# Patient Record
Sex: Female | Born: 1975 | Race: White | Hispanic: No | Marital: Married | Smoking: Never smoker
Health system: Southern US, Community
[De-identification: ages and names within clinical notes are randomized; demographics above are authoritative.]

## PROBLEM LIST (undated history)

## (undated) HISTORY — PX: LUMBAR LAMINECTOMY: SHX95

---

## 2011-12-31 ENCOUNTER — Other Ambulatory Visit (HOSPITAL_COMMUNITY)
Admission: RE | Admit: 2011-12-31 | Discharge: 2011-12-31 | Disposition: A | Discharge status: Home / Self Care | Payer: Self-pay | Source: Ambulatory Visit | Attending: Obstetrics & Gynecology | Admitting: Obstetrics & Gynecology

## 2011-12-31 DIAGNOSIS — Z01419 Encounter for gynecological examination (general) (routine) without abnormal findings: Secondary | ICD-10-CM | POA: Insufficient documentation

## 2013-12-10 ENCOUNTER — Telehealth: Payer: Self-pay

## 2013-12-10 MED ORDER — NORGESTIMATE-ETH ESTRADIOL 0.25-35 MG-MCG PO TABS: 1.0000 | ORAL_TABLET | Freq: Every day | ORAL | Status: DC

## 2013-12-10 NOTE — Telephone Encounter (Signed)
Pt requesting refill on sprintec. Pt has an appt for P&P in March.

## 2014-01-27 ENCOUNTER — Telehealth: Payer: Self-pay | Admitting: *Deleted

## 2014-01-27 NOTE — Telephone Encounter (Signed)
Informed pt that on 12/10/13 we refilled Rx with 11 refills, instructed her to call her pharmacy for refill and if there are any problems let us know.  Pt verbalized understanding.

## 2014-02-08 ENCOUNTER — Other Ambulatory Visit: Payer: Self-pay | Admitting: Obstetrics & Gynecology

## 2014-02-16 ENCOUNTER — Ambulatory Visit (INDEPENDENT_AMBULATORY_CARE_PROVIDER_SITE_OTHER): Payer: BC Managed Care – PPO | Admitting: Obstetrics & Gynecology

## 2014-02-16 ENCOUNTER — Other Ambulatory Visit (HOSPITAL_COMMUNITY)
Admission: RE | Admit: 2014-02-16 | Discharge: 2014-02-16 | Disposition: A | Discharge status: Home / Self Care | Payer: BC Managed Care – PPO | Source: Ambulatory Visit | Attending: Obstetrics & Gynecology | Admitting: Obstetrics & Gynecology

## 2014-02-16 ENCOUNTER — Encounter: Payer: Self-pay | Admitting: Obstetrics & Gynecology

## 2014-02-16 VITALS — BP 110/70 | Ht 64.0 in | Wt 137.0 lb

## 2014-02-16 DIAGNOSIS — Z1151 Encounter for screening for human papillomavirus (HPV): Secondary | ICD-10-CM | POA: Insufficient documentation

## 2014-02-16 DIAGNOSIS — Z01419 Encounter for gynecological examination (general) (routine) without abnormal findings: Secondary | ICD-10-CM | POA: Insufficient documentation

## 2014-02-16 MED ORDER — NORGESTIMATE-ETH ESTRADIOL 0.25-35 MG-MCG PO TABS: 1.0000 | ORAL_TABLET | Freq: Every day | ORAL | Status: DC

## 2014-02-16 NOTE — Addendum Note (Signed)
Addended by: Lazaro ArmsEURE, Lennie Vasco H on: 02/16/2014 10:09 AM   Modules accepted: Orders

## 2014-02-16 NOTE — Addendum Note (Signed)
Addended by: Richardson ChiquitoRAVIS, ASHLEY M on: 02/16/2014 10:57 AM   Modules accepted: Orders

## 2014-02-16 NOTE — Progress Notes (Signed)
Patient ID: Rhonda Rhodes, female   DOB: 06/11/1976, 38 y.o.   MRN: [id number] Subjective:     Rhonda Rhodes is a 38 y.o. female here for a routine exam.  Patient's last menstrual period was 01/22/2014. No obstetric history on file. Birth Control Method:  OCP Menstrual Calendar(currently): regular  Current complaints: none.   Current acute medical issues:  none   Recent Gynecologic History Patient's last menstrual period was 01/22/2014. Last Pap: 2014,  normal Last mammogram: na,    History reviewed. No pertinent past medical history.  History reviewed. No pertinent past surgical history.  OB History   Grav Para Term Preterm Abortions TAB SAB Ect Mult Living                  History   Social History  . Marital Status: Unknown    Spouse Name: N/A    Number of Children: N/A  . Years of Education: N/A   Social History Main Topics  . Smoking status: Never Smoker   . Smokeless tobacco: None  . Alcohol Use: Yes  . Drug Use: None  . Sexual Activity: Yes   Other Topics Concern  . None   Social History Narrative  . None    History reviewed. No pertinent family history.   Review of Systems  Review of Systems  Constitutional: Negative for fever, chills, weight loss, malaise/fatigue and diaphoresis.  HENT: Negative for hearing loss, ear pain, nosebleeds, congestion, sore throat, neck pain, tinnitus and ear discharge.   Eyes: Negative for blurred vision, double vision, photophobia, pain, discharge and redness.  Respiratory: Negative for cough, hemoptysis, sputum production, shortness of breath, wheezing and stridor.   Cardiovascular: Negative for chest pain, palpitations, orthopnea, claudication, leg swelling and PND.  Gastrointestinal: negative for abdominal pain. Negative for heartburn, nausea, vomiting, diarrhea, constipation, blood in stool and melena.  Genitourinary: Negative for dysuria, urgency, frequency, hematuria and flank pain.  Musculoskeletal: Negative  for myalgias, back pain, joint pain and falls.  Skin: Negative for itching and rash.  Neurological: Negative for dizziness, tingling, tremors, sensory change, speech change, focal weakness, seizures, loss of consciousness, weakness and headaches.  Endo/Heme/Allergies: Negative for environmental allergies and polydipsia. Does not bruise/bleed easily.  Psychiatric/Behavioral: Negative for depression, suicidal ideas, hallucinations, memory loss and substance abuse. The patient is not nervous/anxious and does not have insomnia.        Objective:    Physical Exam  Vitals reviewed. Constitutional: She is oriented to person, place, and time. She appears well-developed and well-nourished.  HENT:  Head: Normocephalic and atraumatic.        Right Ear: External ear normal.  Left Ear: External ear normal.  Nose: Nose normal.  Mouth/Throat: Oropharynx is clear and moist.  Eyes: Conjunctivae and EOM are normal. Pupils are equal, round, and reactive to light. Right eye exhibits no discharge. Left eye exhibits no discharge. No scleral icterus.  Neck: Normal range of motion. Neck supple. No tracheal deviation present. No thyromegaly present.  Cardiovascular: Normal rate, regular rhythm, normal heart sounds and intact distal pulses.  Exam reveals no gallop and no friction rub.   No murmur heard. Respiratory: Effort normal and breath sounds normal. No respiratory distress. She has no wheezes. She has no rales. She exhibits no tenderness.  GI: Soft. Bowel sounds are normal. She exhibits no distension and no mass. There is no tenderness. There is no rebound and no guarding.  Genitourinary:  Breasts no masses skin changes or nipple changes bilaterally  Vulva is normal without lesions Vagina is pink moist without discharge Cervix normal in appearance and pap is done Uterus is normal size shape and contour Adnexa is negative with normal sized ovaries   Musculoskeletal: Normal range of motion. She  exhibits no edema and no tenderness.  Neurological: She is alert and oriented to person, place, and time. She has normal reflexes. She displays normal reflexes. No cranial nerve deficit. She exhibits normal muscle tone. Coordination normal.  Skin: Skin is warm and dry. No rash noted. No erythema. No pallor.  Psychiatric: She has a normal mood and affect. Her behavior is normal. Judgment and thought content normal.       Assessment:    Healthy female exam.    Plan:    Contraception: OCP (estrogen/progesterone). Follow up in: 1 year.

## 2015-01-25 ENCOUNTER — Other Ambulatory Visit: Payer: Self-pay | Admitting: *Deleted

## 2015-01-25 MED ORDER — NORGESTIMATE-ETH ESTRADIOL 0.25-35 MG-MCG PO TABS: 1.0000 | ORAL_TABLET | Freq: Every day | ORAL | Status: DC

## 2015-02-21 ENCOUNTER — Other Ambulatory Visit: Payer: Self-pay | Admitting: Obstetrics & Gynecology

## 2015-03-07 ENCOUNTER — Encounter: Payer: Self-pay | Admitting: Obstetrics & Gynecology

## 2015-03-07 ENCOUNTER — Other Ambulatory Visit (HOSPITAL_COMMUNITY)
Admission: RE | Admit: 2015-03-07 | Discharge: 2015-03-07 | Disposition: A | Discharge status: Home / Self Care | Payer: Self-pay | Source: Ambulatory Visit | Attending: Obstetrics & Gynecology | Admitting: Obstetrics & Gynecology

## 2015-03-07 ENCOUNTER — Ambulatory Visit (INDEPENDENT_AMBULATORY_CARE_PROVIDER_SITE_OTHER): Payer: No Typology Code available for payment source | Admitting: Obstetrics & Gynecology

## 2015-03-07 VITALS — BP 108/80 | HR 64 | Ht 64.0 in | Wt 132.0 lb

## 2015-03-07 DIAGNOSIS — Z01419 Encounter for gynecological examination (general) (routine) without abnormal findings: Secondary | ICD-10-CM

## 2015-03-07 DIAGNOSIS — Z01411 Encounter for gynecological examination (general) (routine) with abnormal findings: Secondary | ICD-10-CM | POA: Insufficient documentation

## 2015-03-07 MED ORDER — NORGESTIMATE-ETH ESTRADIOL 0.25-35 MG-MCG PO TABS: 1.0000 | ORAL_TABLET | Freq: Every day | ORAL | Status: DC

## 2015-03-07 NOTE — Progress Notes (Signed)
Patient ID: Rhonda Rhodes, female   DOB: [birth year], 39 y.o.   MRN: [id number] Subjective:     Rhonda Rhodes is a 39 y.o. female here for a routine exam.  Patient's last menstrual period was 02/15/2015. No obstetric history on file. Birth Control Method:  Norgestimate 35 mic EE Menstrual Calendar(currently): regular  Current complaints: none.   Current acute medical issues:  none   Recent Gynecologic History Patient's last menstrual period was 02/15/2015. Last Pap: 2015,  normal Last mammogram: never,    History reviewed. No pertinent past medical history.  History reviewed. No pertinent past surgical history.  OB History    No data available      History   Social History  . Marital Status: Unknown    Spouse Name: N/A  . Number of Children: N/A  . Years of Education: N/A   Social History Main Topics  . Smoking status: Never Smoker   . Smokeless tobacco: Not on file  . Alcohol Use: Yes  . Drug Use: Not on file  . Sexual Activity: Yes   Other Topics Concern  . None   Social History Narrative    History reviewed. No pertinent family history.   Current outpatient prescriptions:  .  norgestimate-ethinyl estradiol (ORTHO-CYCLEN,SPRINTEC,PREVIFEM) 0.25-35 MG-MCG tablet, Take 1 tablet by mouth daily., Disp: 1 Package, Rfl: 11  Review of Systems  Review of Systems  Constitutional: Negative for fever, chills, weight loss, malaise/fatigue and diaphoresis.  HENT: Negative for hearing loss, ear pain, nosebleeds, congestion, sore throat, neck pain, tinnitus and ear discharge.   Eyes: Negative for blurred vision, double vision, photophobia, pain, discharge and redness.  Respiratory: Negative for cough, hemoptysis, sputum production, shortness of breath, wheezing and stridor.   Cardiovascular: Negative for chest pain, palpitations, orthopnea, claudication, leg swelling and PND.  Gastrointestinal: negative for abdominal pain. Negative for heartburn, nausea, vomiting,  diarrhea, constipation, blood in stool and melena.  Genitourinary: Negative for dysuria, urgency, frequency, hematuria and flank pain.  Musculoskeletal: Negative for myalgias, back pain, joint pain and falls.  Skin: Negative for itching and rash.  Neurological: Negative for dizziness, tingling, tremors, sensory change, speech change, focal weakness, seizures, loss of consciousness, weakness and headaches.  Endo/Heme/Allergies: Negative for environmental allergies and polydipsia. Does not bruise/bleed easily.  Psychiatric/Behavioral: Negative for depression, suicidal ideas, hallucinations, memory loss and substance abuse. The patient is not nervous/anxious and does not have insomnia.        Objective:  Blood pressure 108/80, pulse 64, height  (1.626 m), weight 132 lb (59.875 kg), last menstrual period 02/15/2015.   Physical Exam  Vitals reviewed. Constitutional: She is oriented to person, place, and time. She appears well-developed and well-nourished.  HENT:  Head: Normocephalic and atraumatic.        Right Ear: External ear normal.  Left Ear: External ear normal.  Nose: Nose normal.  Mouth/Throat: Oropharynx is clear and moist.  Eyes: Conjunctivae and EOM are normal. Pupils are equal, round, and reactive to light. Right eye exhibits no discharge. Left eye exhibits no discharge. No scleral icterus.  Neck: Normal range of motion. Neck supple. No tracheal deviation present. No thyromegaly present.  Cardiovascular: Normal rate, regular rhythm, normal heart sounds and intact distal pulses.  Exam reveals no gallop and no friction rub.   No murmur heard. Respiratory: Effort normal and breath sounds normal. No respiratory distress. She has no wheezes. She has no rales. She exhibits no tenderness.  GI: Soft. Bowel sounds are normal. She exhibits  no distension and no mass. There is no tenderness. There is no rebound and no guarding.  Genitourinary:  Breasts no masses skin changes or nipple  changes bilaterally      Vulva is normal without lesions Vagina is pink moist without discharge Cervix normal in appearance and pap is done Uterus is normal size shape and contour Adnexa is negative with normal sized ovaries   Musculoskeletal: Normal range of motion. She exhibits no edema and no tenderness.  Neurological: She is alert and oriented to person, place, and time. She has normal reflexes. She displays normal reflexes. No cranial nerve deficit. She exhibits normal muscle tone. Coordination normal.  Skin: Skin is warm and dry. No rash noted. No erythema. No pallor.  Psychiatric: She has a normal mood and affect. Her behavior is normal. Judgment and thought content normal.       Assessment:    Healthy female exam.    Plan:    Contraception: OCP (estrogen/progesterone).   Follow up 1 year

## 2015-03-08 LAB — CYTOLOGY - PAP

## 2015-10-25 ENCOUNTER — Other Ambulatory Visit: Payer: Self-pay | Admitting: Obstetrics & Gynecology

## 2015-10-25 DIAGNOSIS — Z1231 Encounter for screening mammogram for malignant neoplasm of breast: Secondary | ICD-10-CM

## 2015-10-31 ENCOUNTER — Ambulatory Visit (HOSPITAL_COMMUNITY)
Admission: RE | Admit: 2015-10-31 | Discharge: 2015-10-31 | Disposition: A | Discharge status: Home / Self Care | Payer: No Typology Code available for payment source | Source: Ambulatory Visit | Attending: Obstetrics & Gynecology | Admitting: Obstetrics & Gynecology

## 2015-10-31 DIAGNOSIS — Z1231 Encounter for screening mammogram for malignant neoplasm of breast: Secondary | ICD-10-CM | POA: Diagnosis not present

## 2016-02-14 ENCOUNTER — Other Ambulatory Visit: Payer: Self-pay | Admitting: Obstetrics & Gynecology

## 2016-03-12 ENCOUNTER — Other Ambulatory Visit: Payer: No Typology Code available for payment source | Admitting: Obstetrics & Gynecology

## 2016-03-20 ENCOUNTER — Encounter: Payer: Self-pay | Admitting: Obstetrics & Gynecology

## 2016-03-20 ENCOUNTER — Other Ambulatory Visit (HOSPITAL_COMMUNITY)
Admission: RE | Admit: 2016-03-20 | Discharge: 2016-03-20 | Disposition: A | Discharge status: Home / Self Care | Payer: No Typology Code available for payment source | Source: Ambulatory Visit | Attending: Obstetrics & Gynecology | Admitting: Obstetrics & Gynecology

## 2016-03-20 ENCOUNTER — Ambulatory Visit (INDEPENDENT_AMBULATORY_CARE_PROVIDER_SITE_OTHER): Payer: PRIVATE HEALTH INSURANCE | Admitting: Obstetrics & Gynecology

## 2016-03-20 VITALS — BP 110/80 | HR 58 | Ht 64.0 in | Wt 134.0 lb

## 2016-03-20 DIAGNOSIS — Z01419 Encounter for gynecological examination (general) (routine) without abnormal findings: Secondary | ICD-10-CM | POA: Insufficient documentation

## 2016-03-20 MED ORDER — NORGESTIMATE-ETH ESTRADIOL 0.25-35 MG-MCG PO TABS: 1.0000 | ORAL_TABLET | Freq: Every day | ORAL | Status: DC

## 2016-03-20 NOTE — Addendum Note (Signed)
Addended by: Lazaro ArmsEURE, Zyheir Daft H on: 03/20/2016 02:35 PM   Modules accepted: Orders

## 2016-03-20 NOTE — Progress Notes (Signed)
Patient ID: Rhonda Rhodes, female   DOB: [birth year], 40 y.o.   MRN: [id number] Subjective:     [name] is a 40 y.o. female here for a routine exam.  Patient's last menstrual period was 03/08/2016. No obstetric history on file. Birth Control Method:  OCP Menstrual Calendar(currently): regular, 3 days, some cramps  Current complaints: none.   Current acute medical issues:  none   Recent Gynecologic History Patient's last menstrual period was 03/08/2016. Last Pap: 2016,  normal Last mammogram: 2016,  normal  History reviewed. No pertinent past medical history.  History reviewed. No pertinent past surgical history.  OB History    No data available      Social History   Social History  . Marital Status: Unknown    Spouse Name: N/A  . Number of Children: N/A  . Years of Education: N/A   Social History Main Topics  . Smoking status: Never Smoker   . Smokeless tobacco: None  . Alcohol Use: Yes  . Drug Use: None  . Sexual Activity: Yes   Other Topics Concern  . None   Social History Narrative    History reviewed. No pertinent family history.   Current outpatient prescriptions:  .  norgestimate-ethinyl estradiol (ORTHO-CYCLEN,SPRINTEC,PREVIFEM) 0.25-35 MG-MCG tablet, Take 1 tablet by mouth daily., Disp: 3 Package, Rfl: 3  Review of Systems  Review of Systems  Constitutional: Negative for fever, chills, weight loss, malaise/fatigue and diaphoresis.  HENT: Negative for hearing loss, ear pain, nosebleeds, congestion, sore throat, neck pain, tinnitus and ear discharge.   Eyes: Negative for blurred vision, double vision, photophobia, pain, discharge and redness.  Respiratory: Negative for cough, hemoptysis, sputum production, shortness of breath, wheezing and stridor.   Cardiovascular: Negative for chest pain, palpitations, orthopnea, claudication, leg swelling and PND.  Gastrointestinal: negative for abdominal pain. Negative for heartburn, nausea, vomiting,  diarrhea, constipation, blood in stool and melena.  Genitourinary: Negative for dysuria, urgency, frequency, hematuria and flank pain.  Musculoskeletal: Negative for myalgias, back pain, joint pain and falls.  Skin: Negative for itching and rash.  Neurological: Negative for dizziness, tingling, tremors, sensory change, speech change, focal weakness, seizures, loss of consciousness, weakness and headaches.  Endo/Heme/Allergies: Negative for environmental allergies and polydipsia. Does not bruise/bleed easily.  Psychiatric/Behavioral: Negative for depression, suicidal ideas, hallucinations, memory loss and substance abuse. The patient is not nervous/anxious and does not have insomnia.        Objective:  Blood pressure 110/80, pulse 58, height  (1.626 m), weight 134 lb (60.782 kg), last menstrual period 03/08/2016.   Physical Exam  Vitals reviewed. Constitutional: She is oriented to person, place, and time. She appears well-developed and well-nourished.  HENT:  Head: Normocephalic and atraumatic.        Right Ear: External ear normal.  Left Ear: External ear normal.  Nose: Nose normal.  Mouth/Throat: Oropharynx is clear and moist.  Eyes: Conjunctivae and EOM are normal. Pupils are equal, round, and reactive to light. Right eye exhibits no discharge. Left eye exhibits no discharge. No scleral icterus.  Neck: Normal range of motion. Neck supple. No tracheal deviation present. No thyromegaly present.  Cardiovascular: Normal rate, regular rhythm, normal heart sounds and intact distal pulses.  Exam reveals no gallop and no friction rub.   No murmur heard. Respiratory: Effort normal and breath sounds normal. No respiratory distress. She has no wheezes. She has no rales. She exhibits no tenderness.  GI: Soft. Bowel sounds are normal. She exhibits no distension  and no mass. There is no tenderness. There is no rebound and no guarding.  Genitourinary:  Breasts no masses skin changes or nipple  changes bilaterally      Vulva is normal without lesions Vagina is pink moist without discharge Cervix normal in appearance and pap is done Uterus is normal size shape and contour Adnexa is negative with normal sized ovaries   Musculoskeletal: Normal range of motion. She exhibits no edema and no tenderness.  Neurological: She is alert and oriented to person, place, and time. She has normal reflexes. She displays normal reflexes. No cranial nerve deficit. She exhibits normal muscle tone. Coordination normal.  Skin: Skin is warm and dry. No rash noted. No erythema. No pallor.  Psychiatric: She has a normal mood and affect. Her behavior is normal. Judgment and thought content normal.       Medications Ordered at today's visit: No orders of the defined types were placed in this encounter.    Other orders placed at today's visit: No orders of the defined types were placed in this encounter.      Assessment:    Healthy female exam.    Plan:    Mammogram ordered. Follow up in: 1 year.

## 2016-03-22 LAB — CYTOLOGY - PAP

## 2016-12-19 ENCOUNTER — Other Ambulatory Visit: Payer: Self-pay | Admitting: Obstetrics & Gynecology

## 2016-12-19 DIAGNOSIS — Z1231 Encounter for screening mammogram for malignant neoplasm of breast: Secondary | ICD-10-CM

## 2017-01-07 ENCOUNTER — Ambulatory Visit (HOSPITAL_COMMUNITY)
Admission: RE | Admit: 2017-01-07 | Discharge: 2017-01-07 | Disposition: A | Discharge status: Home / Self Care | Payer: BLUE CROSS/BLUE SHIELD | Source: Ambulatory Visit | Attending: Obstetrics & Gynecology | Admitting: Obstetrics & Gynecology

## 2017-01-07 ENCOUNTER — Ambulatory Visit: Payer: No Typology Code available for payment source

## 2017-01-07 DIAGNOSIS — Z1231 Encounter for screening mammogram for malignant neoplasm of breast: Secondary | ICD-10-CM

## 2017-03-12 ENCOUNTER — Other Ambulatory Visit: Payer: Self-pay | Admitting: Obstetrics & Gynecology

## 2017-03-28 ENCOUNTER — Other Ambulatory Visit: Payer: PRIVATE HEALTH INSURANCE | Admitting: Obstetrics & Gynecology

## 2017-04-18 ENCOUNTER — Ambulatory Visit (INDEPENDENT_AMBULATORY_CARE_PROVIDER_SITE_OTHER): Payer: BLUE CROSS/BLUE SHIELD | Admitting: Obstetrics & Gynecology

## 2017-04-18 ENCOUNTER — Other Ambulatory Visit (HOSPITAL_COMMUNITY)
Admission: RE | Admit: 2017-04-18 | Discharge: 2017-04-18 | Disposition: A | Discharge status: Home / Self Care | Payer: BLUE CROSS/BLUE SHIELD | Source: Ambulatory Visit | Attending: Obstetrics & Gynecology | Admitting: Obstetrics & Gynecology

## 2017-04-18 ENCOUNTER — Encounter: Payer: Self-pay | Admitting: Obstetrics & Gynecology

## 2017-04-18 VITALS — BP 116/68 | HR 60 | Ht 64.0 in | Wt 139.0 lb

## 2017-04-18 DIAGNOSIS — Z01419 Encounter for gynecological examination (general) (routine) without abnormal findings: Secondary | ICD-10-CM | POA: Insufficient documentation

## 2017-04-18 MED ORDER — SILVER SULFADIAZINE 1 % EX CREA: TOPICAL_CREAM | CUTANEOUS | 11 refills | Status: DC

## 2017-04-18 MED ORDER — NORGESTIMATE-ETH ESTRADIOL 0.25-35 MG-MCG PO TABS: 1.0000 | ORAL_TABLET | Freq: Every day | ORAL | 3 refills | Status: DC

## 2017-04-18 NOTE — Progress Notes (Signed)
Subjective:     Rhonda Rhodes is a 41 y.o. female here for a routine exam.  Patient's last menstrual period was 04/05/2017 (exact date). [id number] Birth Control Method:  OCP Menstrual Calendar(currently): regular  Current complaints: episodic area under left axilla since adolescence, inflamed hair follicle vs lymph node Recommend present if persists and topical silvadene.   Current acute medical issues:  none   Recent Gynecologic History Patient's last menstrual period was 04/05/2017 (exact date). Last Pap: 2017,  normal Last mammogram: 2018,  normal  History reviewed. No pertinent past medical history.  History reviewed. No pertinent surgical history.  OB History    Gravida Para Term Preterm AB Living   0 0 0 0 0 0   SAB TAB Ectopic Multiple Live Births   0 0 0 0 0      Social History   Social History  . Marital status: Unknown    Spouse name: N/A  . Number of children: N/A  . Years of education: N/A   Social History Main Topics  . Smoking status: Never Smoker  . Smokeless tobacco: Never Used  . Alcohol use Yes  . Drug use: No  . Sexual activity: Yes    Birth control/ protection: Pill   Other Topics Concern  . None   Social History Narrative  . None    Family History  Problem Relation Age of Onset  . Parkinson's disease Father      Current Outpatient Prescriptions:  .  norgestimate-ethinyl estradiol (SPRINTEC 28) 0.25-35 MG-MCG tablet, Take 1 tablet by mouth daily., Disp: 84 tablet, Rfl: 3 .  silver sulfADIAZINE (SILVADENE) 1 % cream, Apply to area TID prn, Disp: 50 g, Rfl: 11  Review of Systems  Review of Systems  Constitutional: Negative for fever, chills, weight loss, malaise/fatigue and diaphoresis.  HENT: Negative for hearing loss, ear pain, nosebleeds, congestion, sore throat, neck pain, tinnitus and ear discharge.   Eyes: Negative for blurred vision, double vision, photophobia, pain, discharge and redness.  Respiratory: Negative for cough,  hemoptysis, sputum production, shortness of breath, wheezing and stridor.   Cardiovascular: Negative for chest pain, palpitations, orthopnea, claudication, leg swelling and PND.  Gastrointestinal: negative for abdominal pain. Negative for heartburn, nausea, vomiting, diarrhea, constipation, blood in stool and melena.  Genitourinary: Negative for dysuria, urgency, frequency, hematuria and flank pain.  Musculoskeletal: Negative for myalgias, back pain, joint pain and falls.  Skin: Negative for itching and rash.  Neurological: Negative for dizziness, tingling, tremors, sensory change, speech change, focal weakness, seizures, loss of consciousness, weakness and headaches.  Endo/Heme/Allergies: Negative for environmental allergies and polydipsia. Does not bruise/bleed easily.  Psychiatric/Behavioral: Negative for depression, suicidal ideas, hallucinations, memory loss and substance abuse. The patient is not nervous/anxious and does not have insomnia.        Objective:  Blood pressure 116/68, pulse 60, height 5\' 4"  (1.626 m), weight 139 lb (63 kg), last menstrual period 04/05/2017.   Physical Exam  Vitals reviewed. Constitutional: She is oriented to person, place, and time. She appears well-developed and well-nourished.  HENT:  Head: Normocephalic and atraumatic.        Right Ear: External ear normal.  Left Ear: External ear normal.  Nose: Nose normal.  Mouth/Throat: Oropharynx is clear and moist.  Eyes: Conjunctivae and EOM are normal. Pupils are equal, round, and reactive to light. Right eye exhibits no discharge. Left eye exhibits no discharge. No scleral icterus.  Neck: Normal range of motion. Neck supple. No tracheal deviation present.  No thyromegaly present.  Cardiovascular: Normal rate, regular rhythm, normal heart sounds and intact distal pulses.  Exam reveals no gallop and no friction rub.   No murmur heard. Respiratory: Effort normal and breath sounds normal. No respiratory distress.  She has no wheezes. She has no rales. She exhibits no tenderness.  GI: Soft. Bowel sounds are normal. She exhibits no distension and no mass. There is no tenderness. There is no rebound and no guarding.  Genitourinary:  Breasts no masses skin changes or nipple changes bilaterally      Vulva is normal without lesions Vagina is pink moist without discharge Cervix normal in appearance and pap is done Uterus is normal size shape and contour Adnexa is negative with normal sized ovaries   Musculoskeletal: Normal range of motion. She exhibits no edema and no tenderness.  Neurological: She is alert and oriented to person, place, and time. She has normal reflexes. She displays normal reflexes. No cranial nerve deficit. She exhibits normal muscle tone. Coordination normal.  Skin: Skin is warm and dry. No rash noted. No erythema. No pallor.  Psychiatric: She has a normal mood and affect. Her behavior is normal. Judgment and thought content normal.       Medications Ordered at today's visit: Meds ordered this encounter  Medications  . silver sulfADIAZINE (SILVADENE) 1 % cream    Sig: Apply to area TID prn    Dispense:  50 g    Refill:  11  . norgestimate-ethinyl estradiol (SPRINTEC 28) 0.25-35 MG-MCG tablet    Sig: Take 1 tablet by mouth daily.    Dispense:  84 tablet    Refill:  3    Please consider 90 day supplies to promote better adherence    Other orders placed at today's visit: No orders of the defined types were placed in this encounter.     Assessment:    Healthy female exam.    Plan:    Contraception: OCP (estrogen/progesterone). Mammogram ordered. Follow up in: 1 year.     Return in about 1 year (around 04/18/2018) for yearly, with Dr Despina HiddenEure.

## 2017-04-23 LAB — CYTOLOGY - PAP
Diagnosis: NEGATIVE
HPV: NOT DETECTED

## 2017-10-28 ENCOUNTER — Encounter: Payer: Self-pay | Admitting: Obstetrics & Gynecology

## 2018-04-22 ENCOUNTER — Ambulatory Visit (INDEPENDENT_AMBULATORY_CARE_PROVIDER_SITE_OTHER): Payer: BLUE CROSS/BLUE SHIELD | Admitting: Obstetrics & Gynecology

## 2018-04-22 ENCOUNTER — Other Ambulatory Visit (HOSPITAL_COMMUNITY)
Admission: RE | Admit: 2018-04-22 | Discharge: 2018-04-22 | Disposition: A | Discharge status: Home / Self Care | Payer: BLUE CROSS/BLUE SHIELD | Source: Ambulatory Visit | Attending: Obstetrics & Gynecology | Admitting: Obstetrics & Gynecology

## 2018-04-22 ENCOUNTER — Encounter: Payer: Self-pay | Admitting: Obstetrics & Gynecology

## 2018-04-22 VITALS — BP 110/80 | HR 97 | Ht 64.0 in | Wt 136.0 lb

## 2018-04-22 DIAGNOSIS — Z01419 Encounter for gynecological examination (general) (routine) without abnormal findings: Secondary | ICD-10-CM

## 2018-04-22 MED ORDER — NORGESTIMATE-ETH ESTRADIOL 0.25-35 MG-MCG PO TABS: 1.0000 | ORAL_TABLET | Freq: Every day | ORAL | 3 refills | Status: DC

## 2018-04-22 NOTE — Addendum Note (Signed)
Addended by: Federico Flake A on: 04/22/2018 12:35 PM   Modules accepted: Orders

## 2018-04-22 NOTE — Progress Notes (Signed)
Subjective:     Rhonda Rhodes is a 42 y.o. female here for a routine exam.  Patient's last menstrual period was 04/05/2018. G0P0000 Birth Control Method:  OCP Menstrual Calendar(currently): regular  Current complaints: small enlarged lymph node right axilla.   Current acute medical issues:     Recent Gynecologic History Patient's last menstrual period was 04/05/2018. Last Pap: 2018,  normal Last mammogram: 2018,  normal  History reviewed. No pertinent past medical history.  History reviewed. No pertinent surgical history.  OB History    Gravida  0   Para  0   Term  0   Preterm  0   AB  0   Living  0     SAB  0   TAB  0   Ectopic  0   Multiple  0   Live Births  0           Social History   Socioeconomic History  . Marital status: Unknown    Spouse name: Not on file  . Number of children: Not on file  . Years of education: Not on file  . Highest education level: Not on file  Occupational History  . Not on file  Social Needs  . Financial resource strain: Not on file  . Food insecurity:    Worry: Not on file    Inability: Not on file  . Transportation needs:    Medical: Not on file    Non-medical: Not on file  Tobacco Use  . Smoking status: Never Smoker  . Smokeless tobacco: Never Used  Substance and Sexual Activity  . Alcohol use: Yes  . Drug use: No  . Sexual activity: Yes    Birth control/protection: Pill  Lifestyle  . Physical activity:    Days per week: Not on file    Minutes per session: Not on file  . Stress: Not on file  Relationships  . Social connections:    Talks on phone: Not on file    Gets together: Not on file    Attends religious service: Not on file    Active member of club or organization: Not on file    Attends meetings of clubs or organizations: Not on file    Relationship status: Not on file  Other Topics Concern  . Not on file  Social History Narrative  . Not on file    Family History  Problem Relation  Age of Onset  . Parkinson's disease Father      Current Outpatient Medications:  .  norgestimate-ethinyl estradiol (SPRINTEC 28) 0.25-35 MG-MCG tablet, Take 1 tablet by mouth daily., Disp: 84 tablet, Rfl: 3 .  silver sulfADIAZINE (SILVADENE) 1 % cream, Apply to area TID prn (Patient not taking: Reported on 04/22/2018), Disp: 50 g, Rfl: 11  Review of Systems  Review of Systems  Constitutional: Negative for fever, chills, weight loss, malaise/fatigue and diaphoresis.  HENT: Negative for hearing loss, ear pain, nosebleeds, congestion, sore throat, neck pain, tinnitus and ear discharge.   Eyes: Negative for blurred vision, double vision, photophobia, pain, discharge and redness.  Respiratory: Negative for cough, hemoptysis, sputum production, shortness of breath, wheezing and stridor.   Cardiovascular: Negative for chest pain, palpitations, orthopnea, claudication, leg swelling and PND.  Gastrointestinal: negative for abdominal pain. Negative for heartburn, nausea, vomiting, diarrhea, constipation, blood in stool and melena.  Genitourinary: Negative for dysuria, urgency, frequency, hematuria and flank pain.  Musculoskeletal: Negative for myalgias, back pain, joint pain and falls.  Skin: Negative for itching and rash.  Neurological: Negative for dizziness, tingling, tremors, sensory change, speech change, focal weakness, seizures, loss of consciousness, weakness and headaches.  Endo/Heme/Allergies: Negative for environmental allergies and polydipsia. Does not bruise/bleed easily.  Psychiatric/Behavioral: Negative for depression, suicidal ideas, hallucinations, memory loss and substance abuse. The patient is not nervous/anxious and does not have insomnia.        Objective:  Blood pressure 110/80, pulse 97, height  (1.626 m), weight 136 lb (61.7 kg), last menstrual period 04/05/2018.   Physical Exam  Vitals reviewed. Constitutional: She is oriented to person, place, and time. She appears  well-developed and well-nourished.  HENT:  Head: Normocephalic and atraumatic.        Right Ear: External ear normal.  Left Ear: External ear normal.  Nose: Nose normal.  Mouth/Throat: Oropharynx is clear and moist.  Eyes: Conjunctivae and EOM are normal. Pupils are equal, round, and reactive to light. Right eye exhibits no discharge. Left eye exhibits no discharge. No scleral icterus.  Neck: Normal range of motion. Neck supple. No tracheal deviation present. No thyromegaly present.  Cardiovascular: Normal rate, regular rhythm, normal heart sounds and intact distal pulses.  Exam reveals no gallop and no friction rub.   No murmur heard. Respiratory: Effort normal and breath sounds normal. No respiratory distress. She has no wheezes. She has no rales. She exhibits no tenderness.  GI: Soft. Bowel sounds are normal. She exhibits no distension and no mass. There is no tenderness. There is no rebound and no guarding.  Genitourinary:  Breasts no masses skin changes or nipple changes bilaterally      Vulva is normal without lesions Vagina is pink moist without discharge Cervix normal in appearance and pap is done Uterus is normal size shape and contour Adnexa is negative with normal sized ovaries   Musculoskeletal: Normal range of motion. She exhibits no edema and no tenderness.  Neurological: She is alert and oriented to person, place, and time. She has normal reflexes. She displays normal reflexes. No cranial nerve deficit. She exhibits normal muscle tone. Coordination normal.  Skin: Skin is warm and dry. No rash noted. No erythema. No pallor.  Psychiatric: She has a normal mood and affect. Her behavior is normal. Judgment and thought content normal.       Medications Ordered at today's visit: Meds ordered this encounter  Medications  . norgestimate-ethinyl estradiol (SPRINTEC 28) 0.25-35 MG-MCG tablet    Sig: Take 1 tablet by mouth daily.    Dispense:  84 tablet    Refill:  3    Please  consider 90 day supplies to promote better adherence    Other orders placed at today's visit: No orders of the defined types were placed in this encounter.     Assessment:    Healthy female exam.    Plan:    Contraception: OCP (estrogen/progesterone). Mammogram ordered. Follow up in: 1 year.     Return in about 1 year (around 04/23/2019) for yearly, with Dr Despina Hidden.

## 2018-04-23 LAB — CYTOLOGY - PAP
Diagnosis: NEGATIVE
HPV: NOT DETECTED

## 2018-04-24 ENCOUNTER — Encounter: Payer: Self-pay | Admitting: Obstetrics & Gynecology

## 2018-04-24 ENCOUNTER — Other Ambulatory Visit: Payer: Self-pay | Admitting: Obstetrics & Gynecology

## 2018-04-24 MED ORDER — DOXYCYCLINE HYCLATE 100 MG PO TABS: 100.0000 mg | ORAL_TABLET | Freq: Two times a day (BID) | ORAL | 0 refills | Status: DC

## 2018-06-28 ENCOUNTER — Encounter: Payer: Self-pay | Admitting: Obstetrics & Gynecology

## 2018-07-30 DIAGNOSIS — L578 Other skin changes due to chronic exposure to nonionizing radiation: Secondary | ICD-10-CM | POA: Diagnosis not present

## 2018-07-30 DIAGNOSIS — L820 Inflamed seborrheic keratosis: Secondary | ICD-10-CM | POA: Diagnosis not present

## 2018-07-30 DIAGNOSIS — R208 Other disturbances of skin sensation: Secondary | ICD-10-CM | POA: Diagnosis not present

## 2018-07-30 DIAGNOSIS — L821 Other seborrheic keratosis: Secondary | ICD-10-CM | POA: Diagnosis not present

## 2018-08-11 ENCOUNTER — Encounter: Payer: Self-pay | Admitting: *Deleted

## 2018-08-11 ENCOUNTER — Other Ambulatory Visit: Payer: Self-pay | Admitting: *Deleted

## 2018-08-11 DIAGNOSIS — R599 Enlarged lymph nodes, unspecified: Secondary | ICD-10-CM

## 2018-08-19 ENCOUNTER — Ambulatory Visit (HOSPITAL_COMMUNITY)
Admission: RE | Admit: 2018-08-19 | Discharge: 2018-08-19 | Disposition: A | Discharge status: Home / Self Care | Payer: BLUE CROSS/BLUE SHIELD | Source: Ambulatory Visit | Attending: Obstetrics & Gynecology | Admitting: Obstetrics & Gynecology

## 2018-08-19 ENCOUNTER — Ambulatory Visit (HOSPITAL_COMMUNITY): Payer: BLUE CROSS/BLUE SHIELD

## 2018-08-19 DIAGNOSIS — R922 Inconclusive mammogram: Secondary | ICD-10-CM | POA: Diagnosis not present

## 2018-08-19 DIAGNOSIS — R599 Enlarged lymph nodes, unspecified: Secondary | ICD-10-CM

## 2018-08-19 DIAGNOSIS — N6489 Other specified disorders of breast: Secondary | ICD-10-CM | POA: Diagnosis not present

## 2019-04-01 ENCOUNTER — Other Ambulatory Visit: Payer: Self-pay | Admitting: Obstetrics & Gynecology

## 2019-04-03 NOTE — Telephone Encounter (Signed)
Patient called today requesting a refill on her birth control, she has an appointment for P/P in July.  [place], [place]  [phone]

## 2019-04-06 ENCOUNTER — Telehealth: Payer: Self-pay | Admitting: *Deleted

## 2019-04-06 NOTE — Telephone Encounter (Signed)
Pt is requesting a refill on Sprintec. Thanks!! JSY

## 2019-04-06 NOTE — Telephone Encounter (Signed)
Pt checking status on med refill 

## 2019-04-07 ENCOUNTER — Telehealth: Payer: Self-pay | Admitting: Obstetrics & Gynecology

## 2019-04-07 MED ORDER — NORGESTIMATE-ETH ESTRADIOL 0.25-35 MG-MCG PO TABS: 1.0000 | ORAL_TABLET | Freq: Every day | ORAL | 3 refills | Status: DC

## 2019-04-07 NOTE — Telephone Encounter (Signed)
done

## 2019-05-28 ENCOUNTER — Other Ambulatory Visit: Payer: BLUE CROSS/BLUE SHIELD | Admitting: Obstetrics & Gynecology

## 2019-06-08 ENCOUNTER — Other Ambulatory Visit: Payer: BLUE CROSS/BLUE SHIELD | Admitting: Adult Health

## 2019-06-23 ENCOUNTER — Telehealth: Payer: Self-pay | Admitting: Adult Health

## 2019-06-23 MED ORDER — NORGESTIMATE-ETH ESTRADIOL 0.25-35 MG-MCG PO TABS: 1.0000 | ORAL_TABLET | Freq: Every day | ORAL | 0 refills | Status: DC

## 2019-06-23 NOTE — Telephone Encounter (Signed)
Patient called stating that she had to reschedule her appointment from the 7th to the 10th and she is going to need a refill of her BC pill. Please send refill to pharmacy

## 2019-06-23 NOTE — Addendum Note (Signed)
Addended by: Derrek Monaco A on: 06/23/2019 04:39 PM   Modules accepted: Orders

## 2019-06-23 NOTE — Telephone Encounter (Addendum)
Pt has an appt scheduled for Aug.10 and she will run out of birth control. Is on Sprintec. Can you refill? Thanks!! [place]

## 2019-06-23 NOTE — Telephone Encounter (Signed)
Refilled OCs 

## 2019-07-03 ENCOUNTER — Other Ambulatory Visit: Payer: BLUE CROSS/BLUE SHIELD | Admitting: Adult Health

## 2019-07-06 ENCOUNTER — Encounter: Payer: Self-pay | Admitting: Obstetrics and Gynecology

## 2019-07-06 ENCOUNTER — Other Ambulatory Visit (HOSPITAL_COMMUNITY)
Admission: RE | Admit: 2019-07-06 | Discharge: 2019-07-06 | Disposition: A | Discharge status: Home / Self Care | Payer: No Typology Code available for payment source | Source: Ambulatory Visit | Attending: Obstetrics and Gynecology | Admitting: Obstetrics and Gynecology

## 2019-07-06 ENCOUNTER — Ambulatory Visit (INDEPENDENT_AMBULATORY_CARE_PROVIDER_SITE_OTHER): Payer: No Typology Code available for payment source | Admitting: Obstetrics and Gynecology

## 2019-07-06 ENCOUNTER — Other Ambulatory Visit: Payer: Self-pay

## 2019-07-06 VITALS — BP 133/72 | HR 62 | Ht 65.5 in | Wt 139.4 lb

## 2019-07-06 DIAGNOSIS — Z01419 Encounter for gynecological examination (general) (routine) without abnormal findings: Secondary | ICD-10-CM | POA: Diagnosis not present

## 2019-07-06 DIAGNOSIS — N898 Other specified noninflammatory disorders of vagina: Secondary | ICD-10-CM

## 2019-07-06 LAB — LIPID PANEL
Cholesterol: 216 mg/dL — ABNORMAL HIGH (ref 0–200)
HDL: 98 mg/dL (ref >40)
LDL Cholesterol: 108 mg/dL — ABNORMAL HIGH (ref 0–99)
Total CHOL/HDL Ratio: 2.2 RATIO
Triglycerides: 52 mg/dL (ref <150)
VLDL: 10 mg/dL (ref 0–40)

## 2019-07-06 LAB — CBC
HCT: 38.4 % (ref 36.0–46.0)
Hemoglobin: 12.5 g/dL (ref 12.0–15.0)
MCH: 32.6 pg (ref 26.0–34.0)
MCHC: 32.6 g/dL (ref 30.0–36.0)
MCV: 100.3 fL — ABNORMAL HIGH (ref 80.0–100.0)
Platelets: 230 10*3/uL (ref 150–400)
RBC: 3.83 MIL/uL — ABNORMAL LOW (ref 3.87–5.11)
RDW: 11.9 % (ref 11.5–15.5)
WBC: 5 10*3/uL (ref 4.0–10.5)
nRBC: 0 % (ref 0.0–0.2)

## 2019-07-06 LAB — COMPREHENSIVE METABOLIC PANEL
ALT: 15 U/L (ref 0–44)
AST: 20 U/L (ref 15–41)
Albumin: 4 g/dL (ref 3.5–5.0)
Alkaline Phosphatase: 34 U/L — ABNORMAL LOW (ref 38–126)
Anion gap: 9 (ref 5–15)
BUN: 16 mg/dL (ref 6–20)
CO2: 25 mmol/L (ref 22–32)
Calcium: 9.1 mg/dL (ref 8.9–10.3)
Chloride: 100 mmol/L (ref 98–111)
Creatinine, Ser: 0.69 mg/dL (ref 0.44–1.00)
GFR calc Af Amer: >60 mL/min (ref >60)
GFR calc non Af Amer: >60 mL/min (ref >60)
Glucose, Bld: 96 mg/dL (ref 70–99)
Potassium: 4.5 mmol/L (ref 3.5–5.1)
Sodium: 134 mmol/L — ABNORMAL LOW (ref 135–145)
Total Bilirubin: 0.8 mg/dL (ref 0.3–1.2)
Total Protein: 7.2 g/dL (ref 6.5–8.1)

## 2019-07-06 LAB — POCT WET PREP (WET MOUNT): Trichomonas Wet Prep HPF POC: ABSENT

## 2019-07-06 LAB — HDL CHOLESTEROL: HDL: 107 mg/dL (ref >40)

## 2019-07-06 LAB — LDL CHOLESTEROL, DIRECT: Direct LDL: 104.9 mg/dL — ABNORMAL HIGH (ref 0–99)

## 2019-07-06 MED ORDER — NORGESTIMATE-ETH ESTRADIOL 0.25-35 MG-MCG PO TABS: 1.0000 | ORAL_TABLET | Freq: Every day | ORAL | 3 refills | Status: DC

## 2019-07-06 MED ORDER — METRONIDAZOLE 0.75 % VA GEL: 1.0000 | Freq: Every day | VAGINAL | 1 refills | Status: DC

## 2019-07-06 NOTE — Addendum Note (Signed)
Addended by: Linton Rump on: 07/06/2019 11:05 AM   Modules accepted: Orders

## 2019-07-06 NOTE — Progress Notes (Signed)
Patient ID: Rhonda Rhodes, female   DOB: [birth year], 43 y.o.   MRN: [id number]  Assessment:  Annual Woman Wellness Exam Medication management Small vaginal scar in posterior fornix,? normal variation Plan:  1. pap smear NOTdone, next pap due 2 years 2. televisit  For OCP review annually or prn pap q 3-73yr with HPV 3. CBC, LDL/HDL lipid panel 3    Annual mammogram advised after age 76 Subjective:  Rhonda Rhodes is a 43 y.o. female [place] who presents for annual exam. Patient's last menstrual period was 06/25/2019. The patient has complaints today of None. Only has a period for 2 days while on sprintec. Denies dyspareunia, bleeding after intercourse, no falling that has caused bleeding.  The following portions of the patient's history were reviewed and updated as appropriate: allergies, current medications, past family history, past medical history, past social history, past surgical history and problem list. History reviewed. No pertinent past medical history.  History reviewed. No pertinent surgical history.   Current Outpatient Medications:  .  Loratadine (CLARITIN PO), Take by mouth daily., Disp: , Rfl:  .  norgestimate-ethinyl estradiol (SPRINTEC 28) 0.25-35 MG-MCG tablet, Take 1 tablet by mouth daily., Disp: 84 tablet, Rfl: 0  Review of Systems Constitutional: negative Gastrointestinal: negative Genitourinary: normal  Objective:  BP 133/72 (BP Location: Right Arm, Patient Position: Sitting, Cuff Size: Normal)   Pulse 62   Ht 5' 5.5" (1.664 m)   Wt 139 lb 6.4 oz (63.2 kg)   LMP 06/25/2019   BMI 22.84 kg/m    BMI: Body mass index is 22.84 kg/m.  General Appearance: Alert, appropriate appearance for age. No acute distress HEENT: Grossly normal Neck / Thyroid:  Cardiovascular: RRR; normal S1, S2, no murmur Lungs: CTA bilaterally Back: No CVAT Breast Exam: No masses or nodes.No dimpling, nipple retraction or discharge. Gastrointestinal: Soft, non-tender, no masses or  organomegaly Pelvic Exam:   VAGINA: light brown discharge, small linear scar in posterior fornix,in back wall nontender, runs from left edge of cervix 4 oclock to right posterior cul de sac, pt denies any vaginal trauma. Or pain with exam. CERVIX: normal appearing cervix  UTERUS: uterus is normal tiny, non-tender WET PREP: neg trich,clue red cells, large amount of white cells Lymphatic Exam: Non-palpable nodes in neck, clavicular, axillary, or inguinal regions  Skin: no rash or abnormalities Neurologic: Normal gait and speech, no tremor  Psychiatric: Alert and oriented, appropriate affect.  Urinalysis:Not done  By signing my name below, I, Samul Dada, attest that this documentation has been prepared under the direction and in the presence of Jonnie Kind, MD. Electronically Signed: [place]. 07/06/19. 10:29 AM.  I personally performed the services described in this documentation, which was SCRIBED in my presence. The recorded information has been reviewed and considered accurate. It has been edited as necessary during review. Jonnie Kind, MD

## 2020-08-15 ENCOUNTER — Other Ambulatory Visit: Payer: Self-pay | Admitting: Obstetrics and Gynecology

## 2020-08-17 ENCOUNTER — Other Ambulatory Visit: Payer: Self-pay | Admitting: Obstetrics and Gynecology

## 2020-11-21 ENCOUNTER — Telehealth: Payer: Self-pay | Admitting: Obstetrics & Gynecology

## 2020-11-21 ENCOUNTER — Other Ambulatory Visit: Payer: Self-pay | Admitting: Obstetrics & Gynecology

## 2020-11-21 ENCOUNTER — Telehealth: Payer: Self-pay

## 2020-11-21 MED ORDER — NORGESTIMATE-ETH ESTRADIOL 0.25-35 MG-MCG PO TABS: 1.0000 | ORAL_TABLET | Freq: Every day | ORAL | 3 refills | Status: DC

## 2020-11-21 NOTE — Telephone Encounter (Signed)
Patient left voice message wanting to make sure our office received a fax from her pharmacy to refill her birth control. Clinical staff will follow up with patient.

## 2020-11-21 NOTE — Telephone Encounter (Signed)
Pt is requesting a refill on birth control. Thanks!! JSY

## 2020-11-21 NOTE — Telephone Encounter (Signed)
Refilled per request.

## 2021-01-12 ENCOUNTER — Encounter: Payer: Self-pay | Admitting: Obstetrics & Gynecology

## 2021-08-29 ENCOUNTER — Other Ambulatory Visit: Payer: Self-pay

## 2021-08-29 ENCOUNTER — Encounter: Payer: Self-pay | Admitting: Obstetrics & Gynecology

## 2021-08-29 ENCOUNTER — Ambulatory Visit (INDEPENDENT_AMBULATORY_CARE_PROVIDER_SITE_OTHER): Payer: 59 | Admitting: Obstetrics & Gynecology

## 2021-08-29 VITALS — BP 133/84 | HR 70 | Ht 64.0 in | Wt 144.0 lb

## 2021-08-29 DIAGNOSIS — N644 Mastodynia: Secondary | ICD-10-CM | POA: Diagnosis not present

## 2021-08-29 NOTE — Progress Notes (Signed)
Chief Complaint  Patient presents with   lump in right breast      45 y.o. G0P0000 Patient's last menstrual period was 08/21/2021. The current method of family planning is OCP (estrogen/progesterone).  Outpatient Encounter Medications as of 08/29/2021  Medication Sig   [DISCONTINUED] norgestimate-ethinyl estradiol (SPRINTEC 28) 0.25-35 MG-MCG tablet Take 1 tablet by mouth daily.   [DISCONTINUED] Loratadine (CLARITIN PO) Take by mouth daily.   [DISCONTINUED] metroNIDAZOLE (METROGEL) 0.75 % vaginal gel Place 1 Applicatorful vaginally at bedtime. Apply one applicatorful to vagina at bedtime for 5 days   No facility-administered encounter medications on file as of 08/29/2021.    Subjective Pt has had an area in her right breast that has come and gone now for some time, evaluation in the past has been negative, going back to 2019 She recently has felt this area just under the inferior edge of the breast that has been tender  History reviewed. No pertinent past medical history.  History reviewed. No pertinent surgical history.  OB History     Gravida  0   Para  0   Term  0   Preterm  0   AB  0   Living  0      SAB  0   IAB  0   Ectopic  0   Multiple  0   Live Births  0           No Known Allergies  Social History   Socioeconomic History   Marital status: Married    Spouse name: Not on file   Number of children: Not on file   Years of education: Not on file   Highest education level: Not on file  Occupational History   Not on file  Tobacco Use   Smoking status: Never   Smokeless tobacco: Never  Vaping Use   Vaping Use: Never used  Substance and Sexual Activity   Alcohol use: Yes   Drug use: No   Sexual activity: Yes    Birth control/protection: Pill  Other Topics Concern   Not on file  Social History Narrative   Not on file   Social Determinants of Health   Financial Resource Strain: Not on file  Food Insecurity: Not on file   Transportation Needs: Not on file  Physical Activity: Not on file  Stress: Not on file  Social Connections: Not on file    Family History  Problem Relation Age of Onset   Parkinson's disease Father     Medications:       Current Outpatient Medications:    SPRINTEC 28 0.25-35 MG-MCG tablet, Take 1 tablet by mouth once daily, Disp: 84 tablet, Rfl: 3  Objective Blood pressure 133/84, pulse 70, height 5\' 4"  (1.626 m), weight 144 lb (65.3 kg), last menstrual period 08/21/2021.  Right breast no palpable lesion is noted no tenderness and the patient also is unable to palpate today  Pertinent ROS No burning with urination, frequency or urgency No nausea, vomiting or diarrhea Nor fever chills or other constitutional symptoms   Labs or studies Reviewed reports of her studies previously    Impression Diagnoses this Encounter::   ICD-10-CM   1. Breast tenderness, suspected fibrocystic breast changes  N64.4    no mass today but will arange diagnostic mammogram/sonogram as indicated, she is due for it anyway in January      Established relevant diagnosis(es):   Plan/Recommendations: No orders of the defined types were placed  in this encounter.   Labs or Scans Ordered: No orders of the defined types were placed in this encounter.    Follow up Return if symptoms worsen or fail to improve, for recommend screening 3D mammogram in 2/23.

## 2021-10-16 ENCOUNTER — Other Ambulatory Visit: Payer: Self-pay | Admitting: Obstetrics & Gynecology

## 2021-11-09 ENCOUNTER — Encounter: Payer: Self-pay | Admitting: Obstetrics & Gynecology

## 2021-11-09 DIAGNOSIS — N6310 Unspecified lump in the right breast, unspecified quadrant: Secondary | ICD-10-CM

## 2021-11-10 ENCOUNTER — Other Ambulatory Visit: Payer: Self-pay | Admitting: Obstetrics & Gynecology

## 2021-11-10 DIAGNOSIS — N6310 Unspecified lump in the right breast, unspecified quadrant: Secondary | ICD-10-CM

## 2021-11-14 ENCOUNTER — Encounter: Payer: Self-pay | Admitting: *Deleted

## 2021-11-29 ENCOUNTER — Other Ambulatory Visit: Payer: Self-pay | Admitting: Obstetrics & Gynecology

## 2021-11-29 ENCOUNTER — Inpatient Hospital Stay
Admission: RE | Admit: 2021-11-29 | Discharge: 2021-11-29 | Disposition: A | Discharge status: Home / Self Care | Payer: Self-pay | Source: Ambulatory Visit | Attending: Obstetrics & Gynecology | Admitting: Obstetrics & Gynecology

## 2021-11-29 DIAGNOSIS — N6310 Unspecified lump in the right breast, unspecified quadrant: Secondary | ICD-10-CM

## 2021-11-30 ENCOUNTER — Other Ambulatory Visit: Payer: Self-pay

## 2021-11-30 ENCOUNTER — Ambulatory Visit (HOSPITAL_COMMUNITY): Admission: RE | Admit: 2021-11-30 | Payer: No Typology Code available for payment source | Source: Ambulatory Visit

## 2021-11-30 ENCOUNTER — Other Ambulatory Visit (HOSPITAL_COMMUNITY): Payer: Self-pay | Admitting: Obstetrics & Gynecology

## 2021-11-30 ENCOUNTER — Ambulatory Visit (HOSPITAL_COMMUNITY)
Admission: RE | Admit: 2021-11-30 | Discharge: 2021-11-30 | Disposition: A | Discharge status: Home / Self Care | Payer: No Typology Code available for payment source | Source: Ambulatory Visit | Attending: Obstetrics & Gynecology | Admitting: Obstetrics & Gynecology

## 2021-11-30 DIAGNOSIS — N6310 Unspecified lump in the right breast, unspecified quadrant: Secondary | ICD-10-CM | POA: Insufficient documentation

## 2021-11-30 DIAGNOSIS — R928 Other abnormal and inconclusive findings on diagnostic imaging of breast: Secondary | ICD-10-CM

## 2021-12-12 ENCOUNTER — Other Ambulatory Visit (HOSPITAL_COMMUNITY): Payer: Self-pay | Admitting: Obstetrics & Gynecology

## 2021-12-12 ENCOUNTER — Encounter (HOSPITAL_COMMUNITY): Payer: Self-pay

## 2021-12-12 ENCOUNTER — Other Ambulatory Visit: Payer: Self-pay

## 2021-12-12 ENCOUNTER — Ambulatory Visit (HOSPITAL_COMMUNITY)
Admission: RE | Admit: 2021-12-12 | Discharge: 2021-12-12 | Disposition: A | Discharge status: Home / Self Care | Payer: 59 | Source: Ambulatory Visit | Attending: Obstetrics & Gynecology | Admitting: Obstetrics & Gynecology

## 2021-12-12 DIAGNOSIS — R928 Other abnormal and inconclusive findings on diagnostic imaging of breast: Secondary | ICD-10-CM

## 2021-12-12 MED ORDER — LIDOCAINE HCL (PF) 2 % IJ SOLN
INTRAMUSCULAR | Status: AC
  Filled 2021-12-12: qty 10

## 2021-12-12 MED ORDER — LIDOCAINE-EPINEPHRINE (PF) 1 %-1:200000 IJ SOLN
INTRAMUSCULAR | Status: AC
  Filled 2021-12-12: qty 30

## 2022-04-25 ENCOUNTER — Encounter: Payer: Self-pay | Admitting: Obstetrics & Gynecology

## 2022-09-11 IMAGING — MG DIGITAL DIAGNOSTIC BILAT W/ TOMO W/ CAD
5 of 10 series · 5 of 30 positions shown · non-contrast
Comparison: 01/12/2021 and earlier
COMPARISON: 01/12/2021 and earlier

Addendum:
CLINICAL DATA: Palpable abnormality in the RIGHT breast. Mass was
first noted in [REDACTED] and later was not as easily found. Mass now
is persistent. No associated tenderness.

EXAM:
DIGITAL DIAGNOSTIC BILATERAL MAMMOGRAM WITH TOMOSYNTHESIS AND CAD;
ULTRASOUND RIGHT BREAST LIMITED
TECHNIQUE: Bilateral digital diagnostic mammography and breast tomosynthesis
was performed. The images were evaluated with computer-aided
detection.; Targeted ultrasound examination of the right breast was
performed

[L MLO synth-2D]
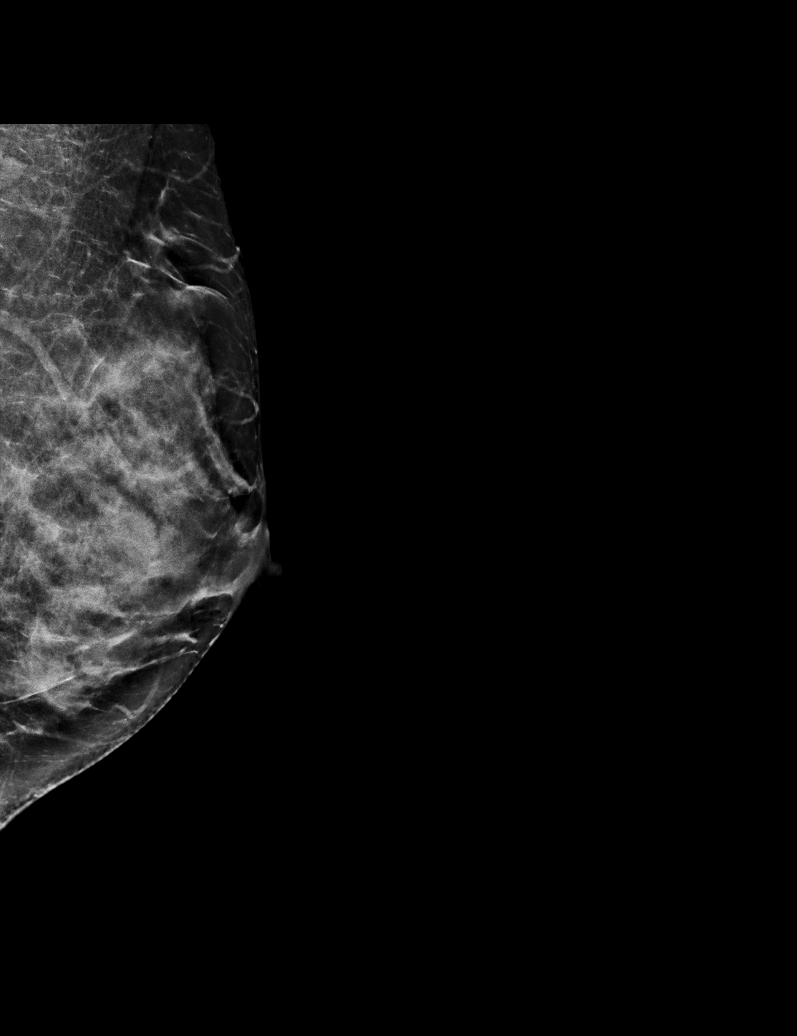

[R TAN synth-2D]
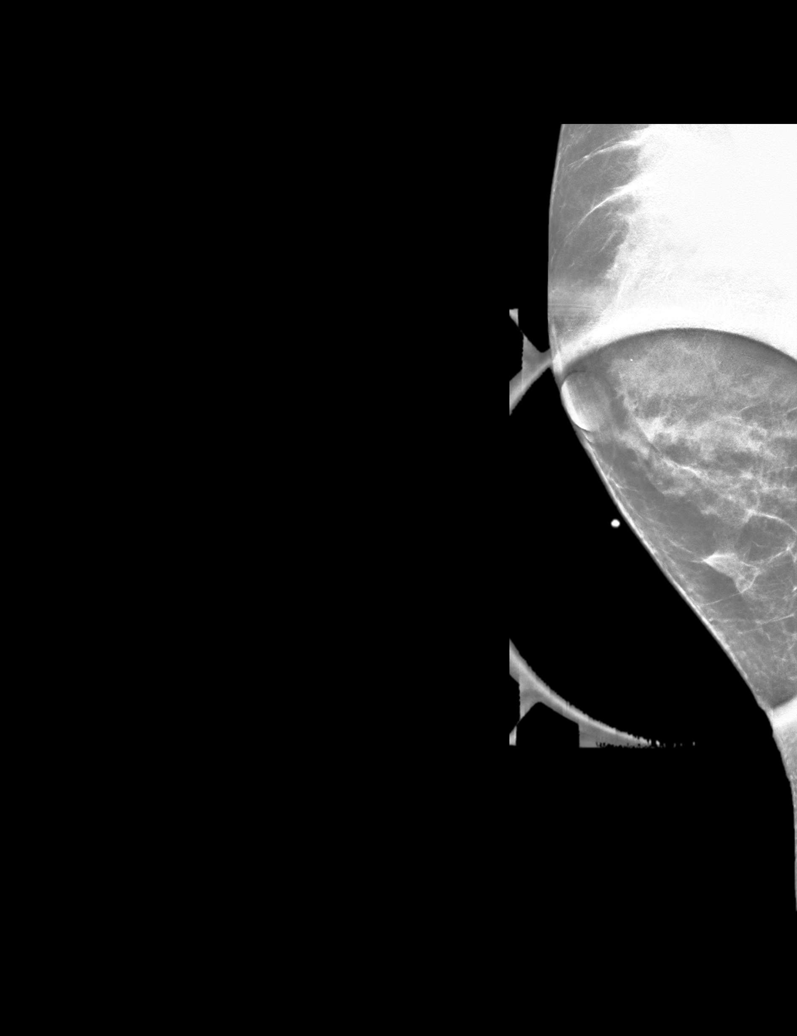

[R MLO synth-2D]
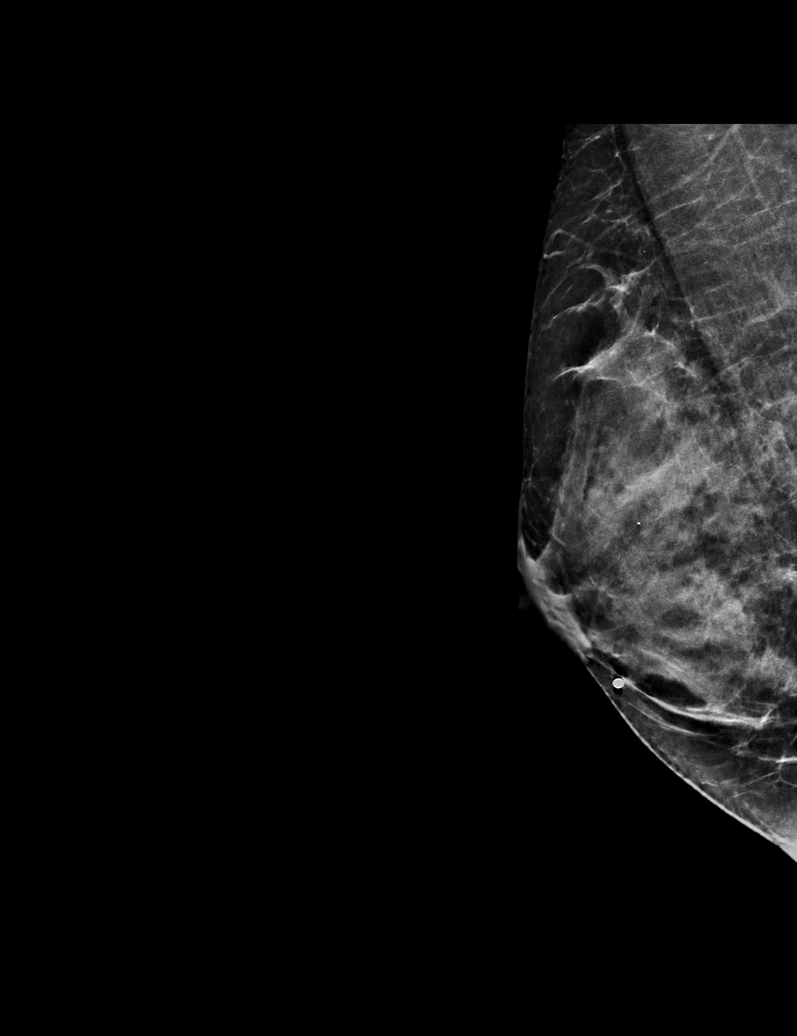

[R CC synth-2D]
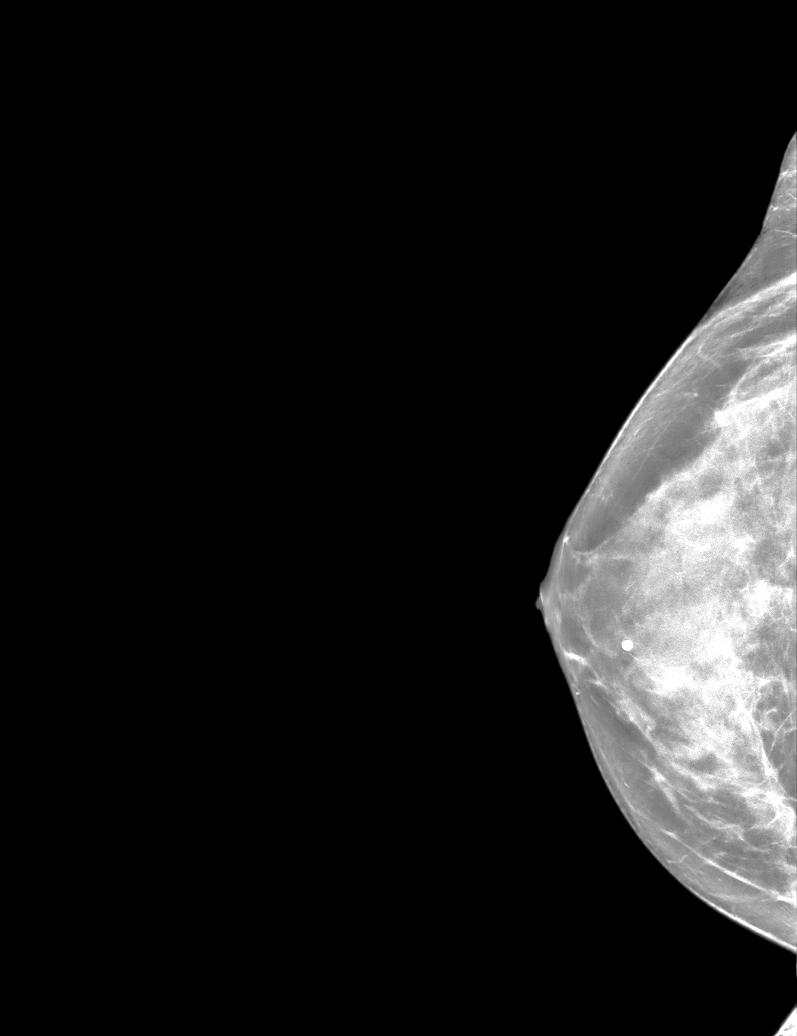

[L CC synth-2D]
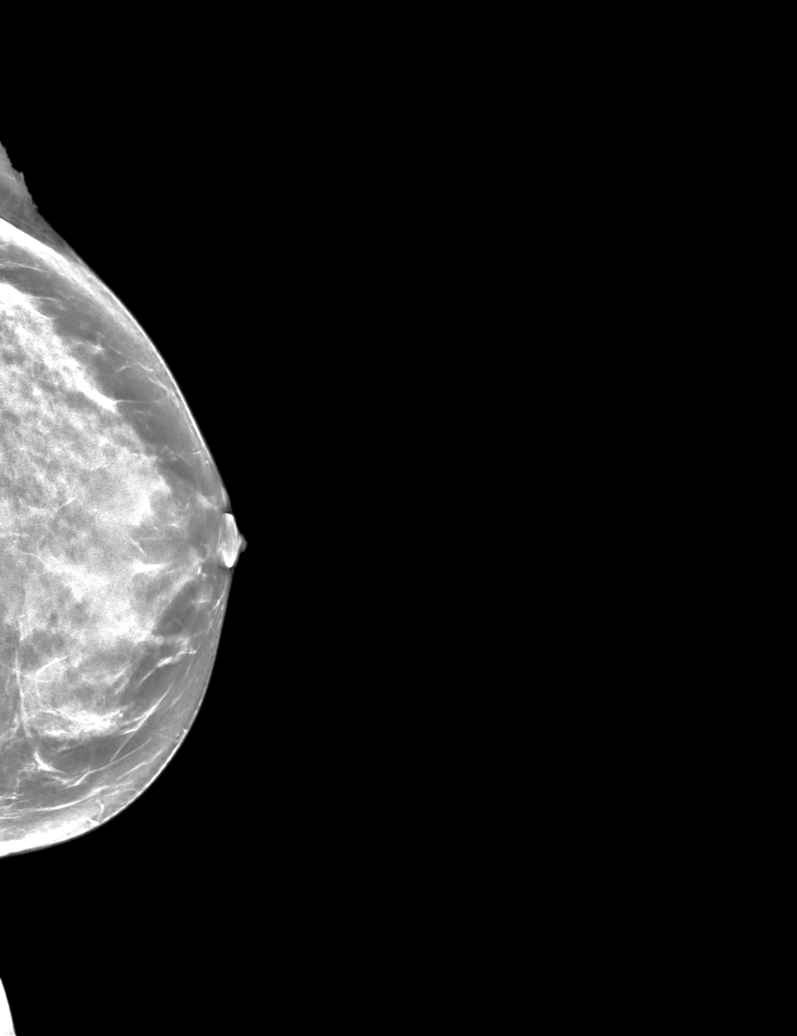

[5 of 30 positions shown; findings below may reference images not displayed]

ACR Breast Density Category d: The breast tissue is extremely dense,
which lowers the sensitivity of mammography.
FINDINGS: LEFT breast is negative. Spot tangential view is performed in the
area concern, in the LOWER central RIGHT breast. This view shows
focal area of asymmetry and associated distortion in the superficial
LOWER central RIGHT breast.

On physical exam, I palpate a discrete soft 8 millimeter nodule in
the 6-630 o'clock location of the RIGHT breast in the area of
patient's concern.

Targeted ultrasound is performed, showing subtle hypoechoic lesion
with posterior acoustic shadowing in the 6 o'clock location of the
RIGHT breast 5 centimeters from the nipple measuring 0.6 x
centimeters.

Evaluation of the RIGHT axilla is negative for adenopathy.
IMPRESSION: Palpable abnormality associated with distortion/asymmetry, likely
representing subtle hypoechoic lesion in the 6 o'clock location on
ultrasound.

No RIGHT axillary adenopathy.

RECOMMENDATION:
Recommend ultrasound-guided core biopsy of RIGHT breast mass.
Recommend evaluation on clip images to determined correlation of the
mammographic and sonographic findings.

I have discussed the findings and recommendations with the patient.
If applicable, a reminder letter will be sent to the patient
regarding the next appointment.

BI-RADS CATEGORY  4: Suspicious.

ADDENDUM:
The patient presented today for ultrasound-guided biopsy of a
palpable mass in the right breast at 6 o'clock 5 cm from nipple felt
to correspond with asymmetry/possible distortion seen in the right
breast mammographically.

Today when the patient presented she felt as if the palpable area of
concern had decreased in size. She stated this was previously
firmer/hard and tends to fluctuate in size.

Sonographic evaluation of the entire lower and central right breast
was performed. The previously seen mass in the right breast at 6
o'clock 5 cm from nipple could not be reproduced despite extensive
scanning by both myself and the sonographer. The patient's mammogram
was reviewed and it was felt that the extremely dense/heterogeneous
fibroglandular pattern is overall unchanged.

Recommend the patient return for six-month diagnostic mammography
and possible ultrasound of the right breast to ensure stable
findings. If there is persistent clinical concern/persistent
palpable abnormality in this location however, then recommend
further evaluation with bilateral breast MRI with contrast.

BI-RADS category: 3: Probably benign.

*** End of Addendum ***
ACR Breast Density Category d: The breast tissue is extremely dense,
which lowers the sensitivity of mammography.
FINDINGS: LEFT breast is negative. Spot tangential view is performed in the
area concern, in the LOWER central RIGHT breast. This view shows
focal area of asymmetry and associated distortion in the superficial
LOWER central RIGHT breast.

On physical exam, I palpate a discrete soft 8 millimeter nodule in
the 6-630 o'clock location of the RIGHT breast in the area of
patient's concern.

Targeted ultrasound is performed, showing subtle hypoechoic lesion
with posterior acoustic shadowing in the 6 o'clock location of the
RIGHT breast 5 centimeters from the nipple measuring 0.6 x
centimeters.

Evaluation of the RIGHT axilla is negative for adenopathy.
IMPRESSION: Palpable abnormality associated with distortion/asymmetry, likely
representing subtle hypoechoic lesion in the 6 o'clock location on
ultrasound.

No RIGHT axillary adenopathy.

RECOMMENDATION:
Recommend ultrasound-guided core biopsy of RIGHT breast mass.
Recommend evaluation on clip images to determined correlation of the
mammographic and sonographic findings.

I have discussed the findings and recommendations with the patient.
If applicable, a reminder letter will be sent to the patient
regarding the next appointment.

BI-RADS CATEGORY  4: Suspicious.

## 2022-09-18 ENCOUNTER — Other Ambulatory Visit: Payer: Self-pay | Admitting: Obstetrics & Gynecology

## 2022-09-23 IMAGING — US US BREAST*R* LIMITED INC AXILLA
2 series · 10 of 10 positions shown · non-contrast
Comparison: 01/12/2021 and earlier
COMPARISON: 01/12/2021 and earlier

Addendum:
CLINICAL DATA: Palpable abnormality in the RIGHT breast. Mass was
first noted in [REDACTED] and later was not as easily found. Mass now
is persistent. No associated tenderness.

EXAM:
DIGITAL DIAGNOSTIC BILATERAL MAMMOGRAM WITH TOMOSYNTHESIS AND CAD;
ULTRASOUND RIGHT BREAST LIMITED
TECHNIQUE: Bilateral digital diagnostic mammography and breast tomosynthesis
was performed. The images were evaluated with computer-aided
detection.; Targeted ultrasound examination of the right breast was
performed

[Series 1: us breast*right* limited inc axilla · 0.07mm/px · 3 of 3 slices shown (1 of 2)]
[im 1/3]
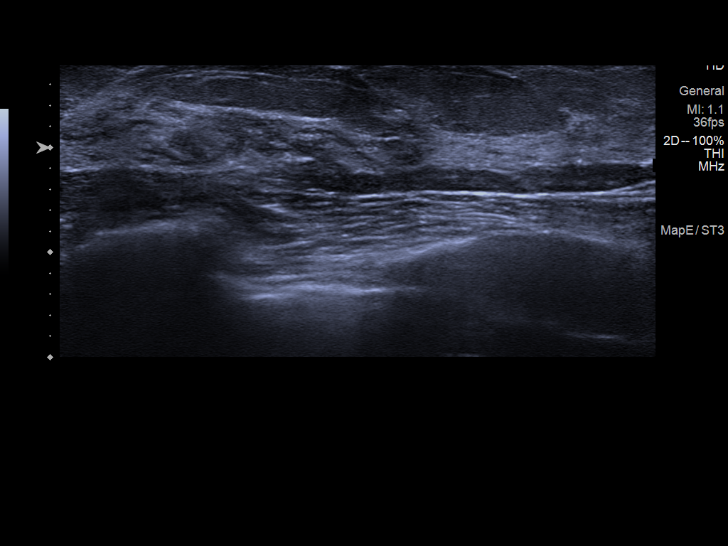
[im 2/3]
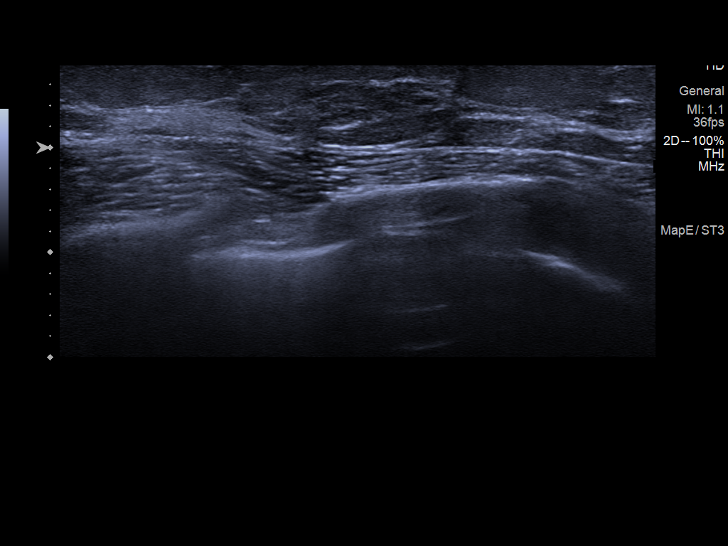
[im 3/3]
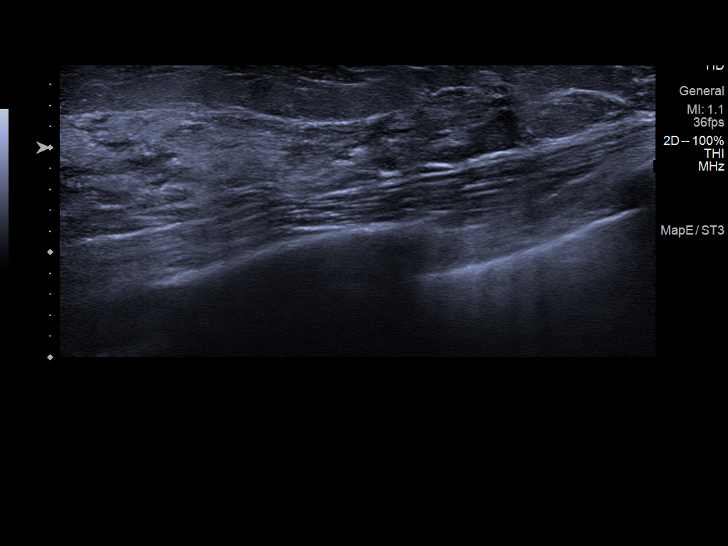

[Series 1: us breast*right* limited inc axilla · 0.07mm/px · 7 of 7 slices shown (2 of 2)]
[im 1/7]
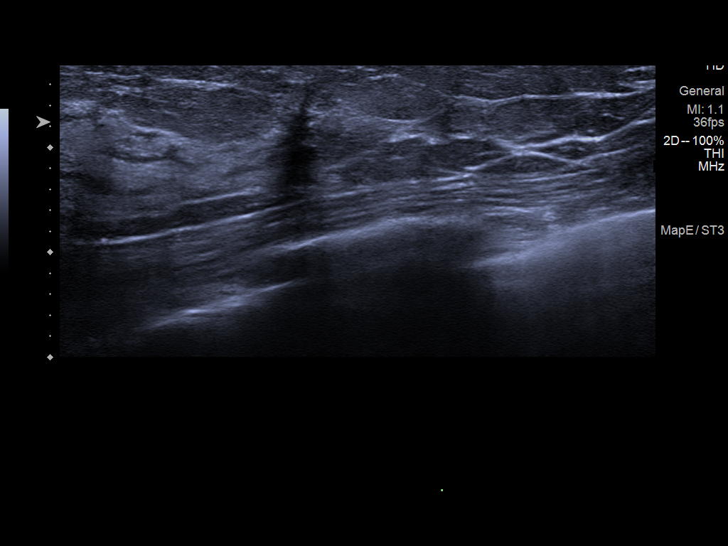
[im 2/7]
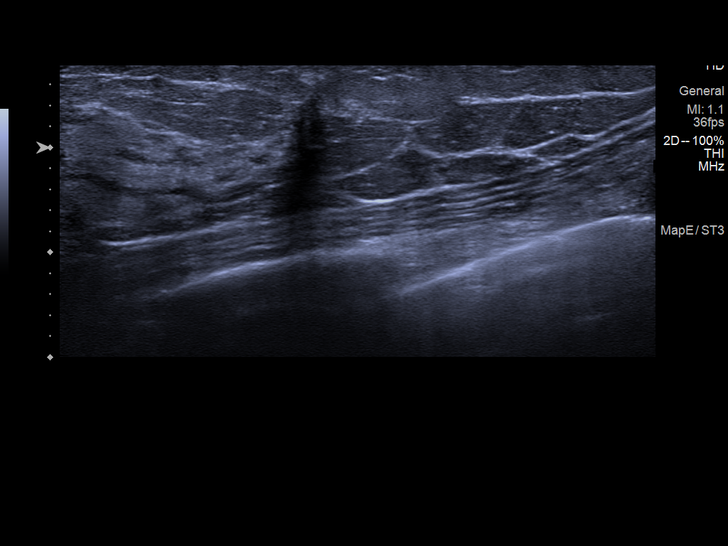
[im 3/7]
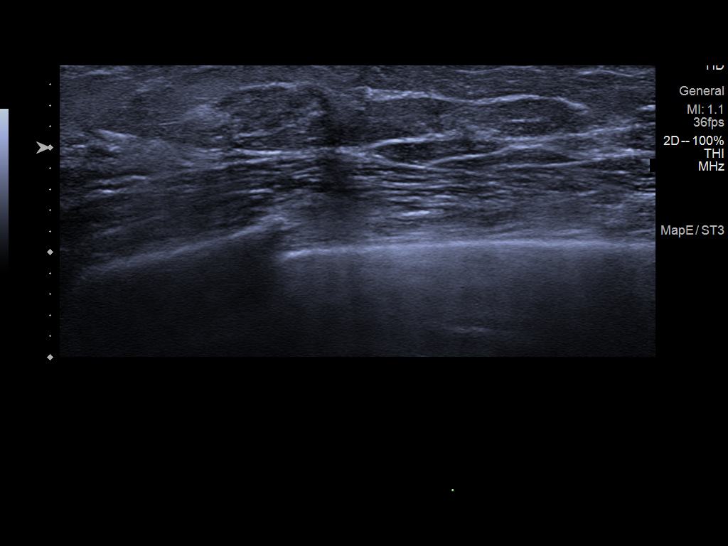
[im 4/7]
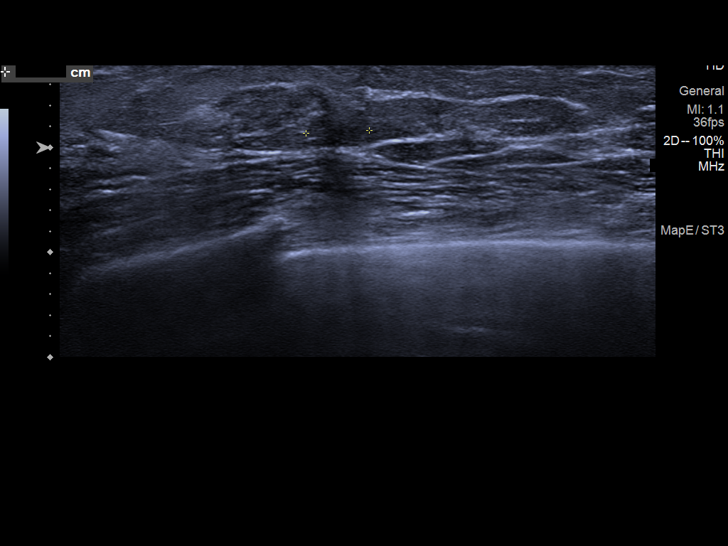
[im 5/7]
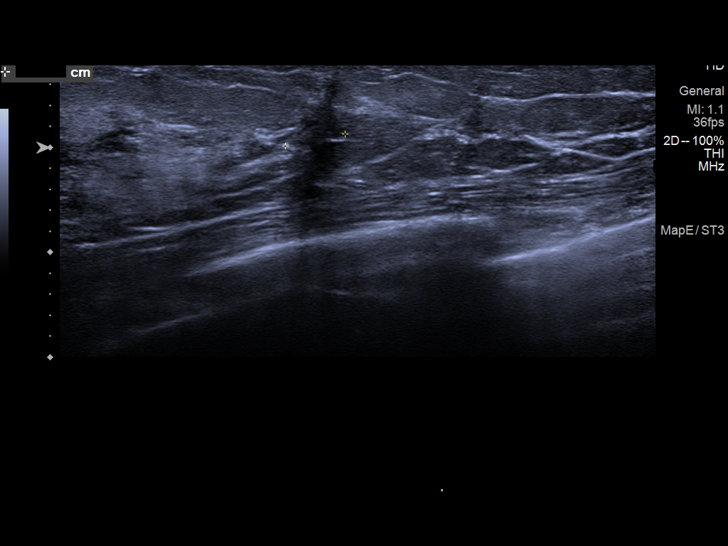
[im 6/7]
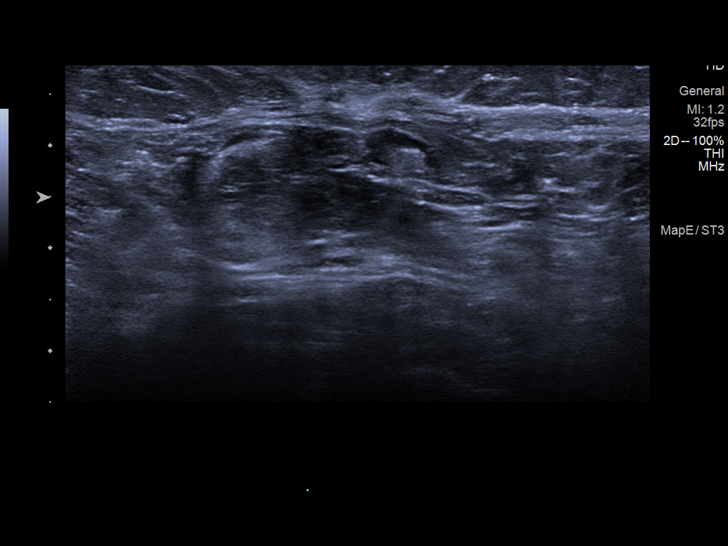
[im 7/7]
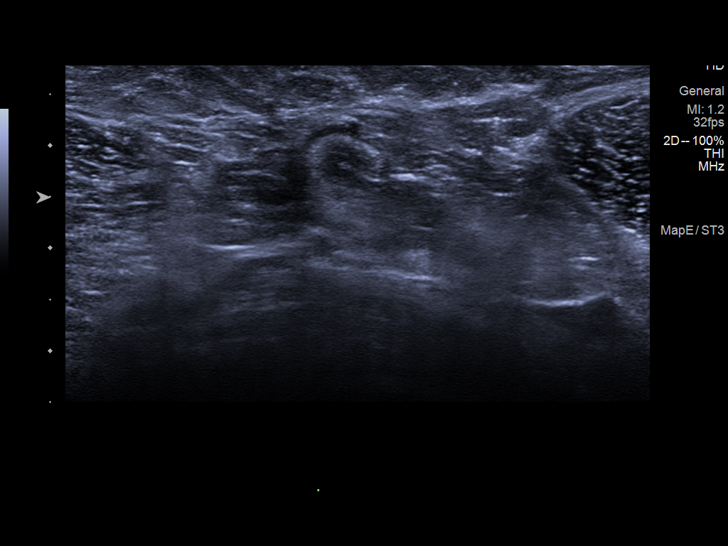

[10 of 10 positions shown; findings below may reference images not displayed]

ACR Breast Density Category d: The breast tissue is extremely dense,
which lowers the sensitivity of mammography.
FINDINGS: LEFT breast is negative. Spot tangential view is performed in the
area concern, in the LOWER central RIGHT breast. This view shows
focal area of asymmetry and associated distortion in the superficial
LOWER central RIGHT breast.

On physical exam, I palpate a discrete soft 8 millimeter nodule in
the 6-630 o'clock location of the RIGHT breast in the area of
patient's concern.

Targeted ultrasound is performed, showing subtle hypoechoic lesion
with posterior acoustic shadowing in the 6 o'clock location of the
RIGHT breast 5 centimeters from the nipple measuring 0.6 x
centimeters.

Evaluation of the RIGHT axilla is negative for adenopathy.
IMPRESSION: Palpable abnormality associated with distortion/asymmetry, likely
representing subtle hypoechoic lesion in the 6 o'clock location on
ultrasound.

No RIGHT axillary adenopathy.

RECOMMENDATION:
Recommend ultrasound-guided core biopsy of RIGHT breast mass.
Recommend evaluation on clip images to determined correlation of the
mammographic and sonographic findings.

I have discussed the findings and recommendations with the patient.
If applicable, a reminder letter will be sent to the patient
regarding the next appointment.

BI-RADS CATEGORY  4: Suspicious.

ADDENDUM:
The patient presented today for ultrasound-guided biopsy of a
palpable mass in the right breast at 6 o'clock 5 cm from nipple felt
to correspond with asymmetry/possible distortion seen in the right
breast mammographically.

Today when the patient presented she felt as if the palpable area of
concern had decreased in size. She stated this was previously
firmer/hard and tends to fluctuate in size.

Sonographic evaluation of the entire lower and central right breast
was performed. The previously seen mass in the right breast at 6
o'clock 5 cm from nipple could not be reproduced despite extensive
scanning by both myself and the sonographer. The patient's mammogram
was reviewed and it was felt that the extremely dense/heterogeneous
fibroglandular pattern is overall unchanged.

Recommend the patient return for six-month diagnostic mammography
and possible ultrasound of the right breast to ensure stable
findings. If there is persistent clinical concern/persistent
palpable abnormality in this location however, then recommend
further evaluation with bilateral breast MRI with contrast.

BI-RADS category: 3: Probably benign.

*** End of Addendum ***
ACR Breast Density Category d: The breast tissue is extremely dense,
which lowers the sensitivity of mammography.
FINDINGS: LEFT breast is negative. Spot tangential view is performed in the
area concern, in the LOWER central RIGHT breast. This view shows
focal area of asymmetry and associated distortion in the superficial
LOWER central RIGHT breast.

On physical exam, I palpate a discrete soft 8 millimeter nodule in
the 6-630 o'clock location of the RIGHT breast in the area of
patient's concern.

Targeted ultrasound is performed, showing subtle hypoechoic lesion
with posterior acoustic shadowing in the 6 o'clock location of the
RIGHT breast 5 centimeters from the nipple measuring 0.6 x
centimeters.

Evaluation of the RIGHT axilla is negative for adenopathy.
IMPRESSION: Palpable abnormality associated with distortion/asymmetry, likely
representing subtle hypoechoic lesion in the 6 o'clock location on
ultrasound.

No RIGHT axillary adenopathy.

RECOMMENDATION:
Recommend ultrasound-guided core biopsy of RIGHT breast mass.
Recommend evaluation on clip images to determined correlation of the
mammographic and sonographic findings.

I have discussed the findings and recommendations with the patient.
If applicable, a reminder letter will be sent to the patient
regarding the next appointment.

BI-RADS CATEGORY  4: Suspicious.

## 2022-09-26 ENCOUNTER — Other Ambulatory Visit: Payer: Self-pay

## 2022-09-26 MED ORDER — NORGESTIMATE-ETH ESTRADIOL 0.25-35 MG-MCG PO TABS: 1.0000 | ORAL_TABLET | Freq: Every day | ORAL | 3 refills | Status: DC

## 2022-10-29 ENCOUNTER — Ambulatory Visit: Payer: 59 | Admitting: Obstetrics & Gynecology

## 2022-11-27 ENCOUNTER — Other Ambulatory Visit (HOSPITAL_COMMUNITY)
Admission: RE | Admit: 2022-11-27 | Discharge: 2022-11-27 | Disposition: A | Discharge status: Home / Self Care | Payer: 59 | Source: Ambulatory Visit | Attending: Obstetrics & Gynecology | Admitting: Obstetrics & Gynecology

## 2022-11-27 ENCOUNTER — Encounter: Payer: Self-pay | Admitting: Obstetrics & Gynecology

## 2022-11-27 ENCOUNTER — Ambulatory Visit (INDEPENDENT_AMBULATORY_CARE_PROVIDER_SITE_OTHER): Payer: 59 | Admitting: Obstetrics & Gynecology

## 2022-11-27 VITALS — BP 121/80 | HR 63 | Ht 64.0 in | Wt 144.0 lb

## 2022-11-27 DIAGNOSIS — Z01419 Encounter for gynecological examination (general) (routine) without abnormal findings: Secondary | ICD-10-CM | POA: Insufficient documentation

## 2022-11-27 DIAGNOSIS — Z1231 Encounter for screening mammogram for malignant neoplasm of breast: Secondary | ICD-10-CM | POA: Diagnosis not present

## 2022-11-27 NOTE — Progress Notes (Signed)
Subjective:     Rhonda Rhodes is a 47 y.o. female here for a routine exam.  Patient's last menstrual period was 11/18/2022. G0P0000 Birth Control Method:  COC Menstrual Calendar(currently): regular  Current complaints: none.   Current acute medical issues:  none   Recent Gynecologic History Patient's last menstrual period was 11/18/2022. Last Pap: 2019,  normal Last mammogram: normal evaluations,  -->[place]  History reviewed. No pertinent past medical history.  History reviewed. No pertinent surgical history.  OB History     Gravida  0   Para  0   Term  0   Preterm  0   AB  0   Living  0      SAB  0   IAB  0   Ectopic  0   Multiple  0   Live Births  0           Social History   Socioeconomic History   Marital status: Married    Spouse name: Not on file   Number of children: Not on file   Years of education: Not on file   Highest education level: Not on file  Occupational History   Not on file  Tobacco Use   Smoking status: Never   Smokeless tobacco: Never  Vaping Use   Vaping Use: Never used  Substance and Sexual Activity   Alcohol use: Yes   Drug use: No   Sexual activity: Yes    Birth control/protection: Pill  Other Topics Concern   Not on file  Social History Narrative   Not on file   Social Determinants of Health   Financial Resource Strain: Low Risk  (11/27/2022)   Overall Financial Resource Strain (CARDIA)    Difficulty of Paying Living Expenses: Not hard at all  Food Insecurity: No Food Insecurity (11/27/2022)   Hunger Vital Sign    Worried About Running Out of Food in the Last Year: Never true    Ran Out of Food in the Last Year: Never true  Transportation Needs: No Transportation Needs (11/27/2022)   PRAPARE - Hydrologist (Medical): No    Lack of Transportation (Non-Medical): No  Physical Activity: Sufficiently Active (11/27/2022)   Exercise Vital Sign    Days of Exercise per Week: 7 days     Minutes of Exercise per Session: 100 min  Stress: No Stress Concern Present (11/27/2022)   [place]    Feeling of Stress : Not at all  Social Connections: Moderately Integrated (11/27/2022)   Social Connection and Isolation Panel [NHANES]    Frequency of Communication with Friends and Family: More than three times a week    Frequency of Social Gatherings with Friends and Family: Three times a week    Attends Religious Services: 1 to 4 times per year    Active Member of Clubs or Organizations: No    Attends Archivist Meetings: Never    Marital Status: Married    Family History  Problem Relation Age of Onset   Parkinson's disease Father      Current Outpatient Medications:    norgestimate-ethinyl estradiol (Nora 28) 0.25-35 MG-MCG tablet, Take 1 tablet by mouth daily., Disp: 84 tablet, Rfl: 3  Review of Systems  Review of Systems  Constitutional: Negative for fever, chills, weight loss, malaise/fatigue and diaphoresis.  HENT: Negative for hearing loss, ear pain, nosebleeds, congestion, sore throat, neck pain, tinnitus and ear discharge.  Eyes: Negative for blurred vision, double vision, photophobia, pain, discharge and redness.  Respiratory: Negative for cough, hemoptysis, sputum production, shortness of breath, wheezing and stridor.   Cardiovascular: Negative for chest pain, palpitations, orthopnea, claudication, leg swelling and PND.  Gastrointestinal: negative for abdominal pain. Negative for heartburn, nausea, vomiting, diarrhea, constipation, blood in stool and melena.  Genitourinary: Negative for dysuria, urgency, frequency, hematuria and flank pain.  Musculoskeletal: Negative for myalgias, back pain, joint pain and falls.  Skin: Negative for itching and rash.  Neurological: Negative for dizziness, tingling, tremors, sensory change, speech change, focal weakness, seizures, loss of consciousness,  weakness and headaches.  Endo/Heme/Allergies: Negative for environmental allergies and polydipsia. Does not bruise/bleed easily.  Psychiatric/Behavioral: Negative for depression, suicidal ideas, hallucinations, memory loss and substance abuse. The patient is not nervous/anxious and does not have insomnia.        Objective:  Blood pressure 121/80, pulse 63, height 5\' 4"  (1.626 m), weight 144 lb (65.3 kg), last menstrual period 11/18/2022.   Physical Exam  Vitals reviewed. Constitutional: She is oriented to person, place, and time. She appears well-developed and well-nourished.  HENT:  Head: Normocephalic and atraumatic.        Right Ear: External ear normal.  Left Ear: External ear normal.  Nose: Nose normal.  Mouth/Throat: Oropharynx is clear and moist.  Eyes: Conjunctivae and EOM are normal. Pupils are equal, round, and reactive to light. Right eye exhibits no discharge. Left eye exhibits no discharge. No scleral icterus.  Neck: Normal range of motion. Neck supple. No tracheal deviation present. No thyromegaly present.  Cardiovascular: Normal rate, regular rhythm, normal heart sounds and intact distal pulses.  Exam reveals no gallop and no friction rub.   No murmur heard. Respiratory: Effort normal and breath sounds normal. No respiratory distress. She has no wheezes. She has no rales. She exhibits no tenderness.  GI: Soft. Bowel sounds are normal. She exhibits no distension and no mass. There is no tenderness. There is no rebound and no guarding.  Genitourinary:  Breasts no masses skin changes or nipple changes bilaterally      Vulva is normal without lesions Vagina is pink moist without discharge Cervix normal in appearance and pap is done Uterus is normal size shape and contour Adnexa is negative with normal sized ovaries   Musculoskeletal: Normal range of motion. She exhibits no edema and no tenderness.  Neurological: She is alert and oriented to person, place, and time. She has  normal reflexes. She displays normal reflexes. No cranial nerve deficit. She exhibits normal muscle tone. Coordination normal.  Skin: Skin is warm and dry. No rash noted. No erythema. No pallor.  Psychiatric: She has a normal mood and affect. Her behavior is normal. Judgment and thought content normal.       Medications Ordered at today's visit: No orders of the defined types were placed in this encounter.   Other orders placed at today's visit: Orders Placed This Encounter  Procedures   MM 3D SCREEN BREAST BILATERAL      Assessment:    Normal Gyn exam.    Plan:    Contraception: OCP (estrogen/progesterone). Mammogram ordered. Follow up in: 1 year.     Return in about 1 year (around 11/28/2023) for yearly.

## 2022-12-04 LAB — CYTOLOGY - PAP
Comment: NEGATIVE
Diagnosis: REACTIVE
High risk HPV: NEGATIVE

## 2023-08-25 ENCOUNTER — Other Ambulatory Visit: Payer: Self-pay | Admitting: Obstetrics & Gynecology

## 2024-01-14 ENCOUNTER — Ambulatory Visit: Payer: BC Managed Care – PPO | Admitting: Obstetrics & Gynecology

## 2024-01-14 VITALS — BP 138/80 | HR 84 | Ht 64.0 in | Wt 145.0 lb

## 2024-01-14 DIAGNOSIS — R599 Enlarged lymph nodes, unspecified: Secondary | ICD-10-CM | POA: Diagnosis not present

## 2024-01-14 DIAGNOSIS — Z01419 Encounter for gynecological examination (general) (routine) without abnormal findings: Secondary | ICD-10-CM | POA: Diagnosis not present

## 2024-01-14 MED ORDER — NORGESTIMATE-ETH ESTRADIOL 0.25-35 MG-MCG PO TABS: 1.0000 | ORAL_TABLET | Freq: Every day | ORAL | 3 refills | Status: AC

## 2024-01-14 NOTE — Progress Notes (Signed)
 Subjective:     Rhonda Rhodes is a 48 y.o. female here for a routine exam.  No LMP recorded. G0P0000 Birth Control Method:  COC Menstrual Calendar(currently): regular  Current complaints: right axillary lymph node.   Current acute medical issues:  none   Recent Gynecologic History No LMP recorded. Last Pap: 11/2022,  normal Last mammogram: 02/2023  normal  No past medical history on file.  No past surgical history on file.  OB History     Gravida  0   Para  0   Term  0   Preterm  0   AB  0   Living  0      SAB  0   IAB  0   Ectopic  0   Multiple  0   Live Births  0           Social History   Socioeconomic History   Marital status: Married    Spouse name: Not on file   Number of children: Not on file   Years of education: Not on file   Highest education level: Not on file  Occupational History   Not on file  Tobacco Use   Smoking status: Never   Smokeless tobacco: Never  Vaping Use   Vaping status: Never Used  Substance and Sexual Activity   Alcohol use: Yes   Drug use: No   Sexual activity: Yes    Birth control/protection: Pill  Other Topics Concern   Not on file  Social History Narrative   Not on file   Social Drivers of Health   Financial Resource Strain: Low Risk  (01/14/2024)   Overall Financial Resource Strain (CARDIA)    Difficulty of Paying Living Expenses: Not hard at all  Food Insecurity: No Food Insecurity (01/14/2024)   Hunger Vital Sign    Worried About Running Out of Food in the Last Year: Never true    Ran Out of Food in the Last Year: Never true  Transportation Needs: No Transportation Needs (01/14/2024)   PRAPARE - Administrator, Civil Service (Medical): No    Lack of Transportation (Non-Medical): No  Physical Activity: Sufficiently Active (01/14/2024)   Exercise Vital Sign    Days of Exercise per Week: 7 days    Minutes of Exercise per Session: 100 min  Stress: No Stress Concern Present (01/14/2024)    [employer] of Occupational Health - Occupational Stress Questionnaire    Feeling of Stress : Not at all  Social Connections: Moderately Integrated (01/14/2024)   Social Connection and Isolation Panel [NHANES]    Frequency of Communication with Friends and Family: More than three times a week    Frequency of Social Gatherings with Friends and Family: More than three times a week    Attends Religious Services: Never    Database administrator or Organizations: Yes    Attends Banker Meetings: 1 to 4 times per year    Marital Status: Married    Family History  Problem Relation Age of Onset   Parkinson's disease Father      Current Outpatient Medications:    norgestimate-ethinyl estradiol (SPRINTEC 28) 0.25-35 MG-MCG tablet, Take 1 tablet by mouth daily., Disp: 84 tablet, Rfl: 3  Review of Systems  Review of Systems  Constitutional: Negative for fever, chills, weight loss, malaise/fatigue and diaphoresis.  HENT: Negative for hearing loss, ear pain, nosebleeds, congestion, sore throat, neck pain, tinnitus and ear discharge.   Eyes:  Negative for blurred vision, double vision, photophobia, pain, discharge and redness.  Respiratory: Negative for cough, hemoptysis, sputum production, shortness of breath, wheezing and stridor.   Cardiovascular: Negative for chest pain, palpitations, orthopnea, claudication, leg swelling and PND.  Gastrointestinal: negative for abdominal pain. Negative for heartburn, nausea, vomiting, diarrhea, constipation, blood in stool and melena.  Genitourinary: Negative for dysuria, urgency, frequency, hematuria and flank pain.  Musculoskeletal: Negative for myalgias, back pain, joint pain and falls.  Skin: Negative for itching and rash.  Neurological: Negative for dizziness, tingling, tremors, sensory change, speech change, focal weakness, seizures, loss of consciousness, weakness and headaches.  Endo/Heme/Allergies: Negative for environmental  allergies and polydipsia. Does not bruise/bleed easily.  Psychiatric/Behavioral: Negative for depression, suicidal ideas, hallucinations, memory loss and substance abuse. The patient is not nervous/anxious and does not have insomnia.        Objective:  Blood pressure 138/80, pulse 84, height 5\' 4"  (1.626 m), weight 145 lb (65.8 kg).   Physical Exam  Vitals reviewed. Constitutional: She is oriented to person, place, and time. She appears well-developed and well-nourished.  HENT:  Head: Normocephalic and atraumatic.        Right Ear: External ear normal.  Left Ear: External ear normal.  Nose: Nose normal.  Mouth/Throat: Oropharynx is clear and moist.  Eyes: Conjunctivae and EOM are normal. Pupils are equal, round, and reactive to light. Right eye exhibits no discharge. Left eye exhibits no discharge. No scleral icterus.  Neck: Normal range of motion. Neck supple. No tracheal deviation present. No thyromegaly present.  Cardiovascular: Normal rate, regular rhythm, normal heart sounds and intact distal pulses.  Exam reveals no gallop and no friction rub.   No murmur heard. Respiratory: Effort normal and breath sounds normal. No respiratory distress. She has no wheezes. She has no rales. She exhibits no tenderness.  GI: Soft. Bowel sounds are normal. She exhibits no distension and no mass. There is no tenderness. There is no rebound and no guarding.  Genitourinary:  Breasts no masses skin changes or nipple changes bilaterally Right axilla with a lymph node enlarged, been there several months      Vulva is normal without lesions Vagina is pink moist without discharge Cervix normal in appearance and pap is not needed Uterus is normal size shape and contour Adnexa is negative with normal sized ovaries   Musculoskeletal: Normal range of motion. She exhibits no edema and no tenderness.  Neurological: She is alert and oriented to person, place, and time. She has normal reflexes. She displays  normal reflexes. No cranial nerve deficit. She exhibits normal muscle tone. Coordination normal.  Skin: Skin is warm and dry. No rash noted. No erythema. No pallor.  Psychiatric: She has a normal mood and affect. Her behavior is normal. Judgment and thought content normal.       Medications Ordered at today's visit: Meds ordered this encounter  Medications   norgestimate-ethinyl estradiol (SPRINTEC 28) 0.25-35 MG-MCG tablet    Sig: Take 1 tablet by mouth daily.    Dispense:  84 tablet    Refill:  3    Other orders placed at today's visit: Orders Placed This Encounter  Procedures   Ambulatory referral to General Surgery     ASSESSMENT + PLAN:    ICD-10-CM   1. Well woman exam with routine gynecological exam  Z01.419     2. Lymph node enlargement, right axilla  R59.9 Ambulatory referral to General Surgery  Return in about 1 year (around 01/13/2025), or if symptoms worsen or fail to improve.

## 2024-01-16 ENCOUNTER — Encounter: Payer: Self-pay | Admitting: Obstetrics & Gynecology

## 2024-01-20 ENCOUNTER — Encounter: Payer: Self-pay | Admitting: Obstetrics & Gynecology

## 2024-02-11 ENCOUNTER — Ambulatory Visit: Payer: BC Managed Care – PPO | Admitting: General Surgery

## 2024-03-03 ENCOUNTER — Ambulatory Visit: Admitting: General Surgery

## 2024-03-03 ENCOUNTER — Encounter: Payer: Self-pay | Admitting: General Surgery

## 2024-03-03 VITALS — BP 134/88 | HR 64 | Temp 97.8°F | Resp 12 | Ht 64.0 in | Wt 141.0 lb

## 2024-03-03 DIAGNOSIS — R2231 Localized swelling, mass and lump, right upper limb: Secondary | ICD-10-CM

## 2024-03-04 NOTE — Addendum Note (Signed)
 Addended by: Phillips Odor on: 03/04/2024 10:39 AM   Modules accepted: Orders

## 2024-03-04 NOTE — Progress Notes (Signed)
 Rhonda Rhodes [place]; [id number]; 1976-08-26   HPI Patient is a 48yoWF who was referred to my care by Dr. Despina Hidden for evaluation and treatment of a right axillary lump.  Has been present for the past few months.  Seems to vary in size.  No drainage noted.  Nontender.  Is due for mammogram soon.  Was not seen last year on ultrasound of the breast/axilla in the past.  No infection in right arm.  Had lumps in axillas many years ago.  No h/o hidradenitis. History reviewed. No pertinent past medical history.  History reviewed. No pertinent surgical history.  Family History  Problem Relation Age of Onset   Parkinson's disease Father     Current Outpatient Medications on File Prior to Visit  Medication Sig Dispense Refill   norgestimate-ethinyl estradiol (SPRINTEC 28) 0.25-35 MG-MCG tablet Take 1 tablet by mouth daily. 84 tablet 3   No current facility-administered medications on file prior to visit.    No Known Allergies  Social History   Substance and Sexual Activity  Alcohol Use Yes    Social History   Tobacco Use  Smoking Status Never  Smokeless Tobacco Never    Review of Systems  Constitutional: Negative.   HENT: Negative.    Eyes: Negative.   Respiratory: Negative.    Cardiovascular: Negative.   Gastrointestinal: Negative.   Genitourinary: Negative.   Musculoskeletal: Negative.   Skin: Negative.   Neurological: Negative.   Endo/Heme/Allergies: Negative.   Psychiatric/Behavioral: Negative.      Objective   Vitals:   03/03/24 1459  BP: 134/88  Pulse: 64  Resp: 12  Temp: 97.8 F (36.6 C)  SpO2: 97%    Physical Exam Vitals reviewed.  Constitutional:      Appearance: Normal appearance. She is normal weight. She is not ill-appearing.  HENT:     Head: Normocephalic and atraumatic.  Cardiovascular:     Rate and Rhythm: Normal rate and regular rhythm.     Heart sounds: Normal heart sounds. No murmur heard.    No friction rub. No gallop.  Pulmonary:     Effort:  Pulmonary effort is normal. No respiratory distress.     Breath sounds: Normal breath sounds. No stridor. No wheezing, rhonchi or rales.  Lymphadenopathy:     Cervical: No cervical adenopathy.  Skin:    General: Skin is warm and dry.  Neurological:     Mental Status: She is alert and oriented to person, place, and time.   Axilla:  Small 1.5cm subcutaneous mobile oval mass along lateral aspect of right axilla.  No deeper lymphadenopathy.   Breast exam deferred as patient just had it with Dr. Despina Hidden within last month. Dr. Despina Hidden note reviewed. Assessment  Right axillary mass, ?lymph node vs residual sebaceous cyst Plan  Patient is scheduled for excision of the right axillary mass on 04/01/2024.  The risks and benefits of the procedure including bleeding and infection were fully explained to the patient, who gave informed consent.

## 2024-03-04 NOTE — H&P (Signed)
 Rhonda Rhodes [place]; [id number]; 1976-08-26   HPI Patient is a 48yoWF who was referred to my care by Dr. Despina Hidden for evaluation and treatment of a right axillary lump.  Has been present for the past few months.  Seems to vary in size.  No drainage noted.  Nontender.  Is due for mammogram soon.  Was not seen last year on ultrasound of the breast/axilla in the past.  No infection in right arm.  Had lumps in axillas many years ago.  No h/o hidradenitis. History reviewed. No pertinent past medical history.  History reviewed. No pertinent surgical history.  Family History  Problem Relation Age of Onset   Parkinson's disease Father     Current Outpatient Medications on File Prior to Visit  Medication Sig Dispense Refill   norgestimate-ethinyl estradiol (SPRINTEC 28) 0.25-35 MG-MCG tablet Take 1 tablet by mouth daily. 84 tablet 3   No current facility-administered medications on file prior to visit.    No Known Allergies  Social History   Substance and Sexual Activity  Alcohol Use Yes    Social History   Tobacco Use  Smoking Status Never  Smokeless Tobacco Never    Review of Systems  Constitutional: Negative.   HENT: Negative.    Eyes: Negative.   Respiratory: Negative.    Cardiovascular: Negative.   Gastrointestinal: Negative.   Genitourinary: Negative.   Musculoskeletal: Negative.   Skin: Negative.   Neurological: Negative.   Endo/Heme/Allergies: Negative.   Psychiatric/Behavioral: Negative.      Objective   Vitals:   03/03/24 1459  BP: 134/88  Pulse: 64  Resp: 12  Temp: 97.8 F (36.6 C)  SpO2: 97%    Physical Exam Vitals reviewed.  Constitutional:      Appearance: Normal appearance. She is normal weight. She is not ill-appearing.  HENT:     Head: Normocephalic and atraumatic.  Cardiovascular:     Rate and Rhythm: Normal rate and regular rhythm.     Heart sounds: Normal heart sounds. No murmur heard.    No friction rub. No gallop.  Pulmonary:     Effort:  Pulmonary effort is normal. No respiratory distress.     Breath sounds: Normal breath sounds. No stridor. No wheezing, rhonchi or rales.  Lymphadenopathy:     Cervical: No cervical adenopathy.  Skin:    General: Skin is warm and dry.  Neurological:     Mental Status: She is alert and oriented to person, place, and time.   Axilla:  Small 1.5cm subcutaneous mobile oval mass along lateral aspect of right axilla.  No deeper lymphadenopathy.   Breast exam deferred as patient just had it with Dr. Despina Hidden within last month. Dr. Despina Hidden note reviewed. Assessment  Right axillary mass, ?lymph node vs residual sebaceous cyst Plan  Patient is scheduled for excision of the right axillary mass on 04/01/2024.  The risks and benefits of the procedure including bleeding and infection were fully explained to the patient, who gave informed consent.

## 2024-03-24 ENCOUNTER — Telehealth: Payer: Self-pay | Admitting: *Deleted

## 2024-03-24 NOTE — Telephone Encounter (Signed)
 Received call from patient [phone]~ telephone.   Patient reports that she will have $5000.00 out of pocket expense for upcoming surgery.   Requested to postpone procedure to July 2025.  Please advise.

## 2024-03-24 NOTE — Telephone Encounter (Signed)
 AMB Referral for Surgery Scheduling   [facility]   Specialty: General Surgery   Physician: Alanda Allegra, MD                Surgery: Excision Mass, RUE Axilla   CPT Codes: 16109   Dx: R22.31   Laterality: Right RT   Anesthesia Type: General   Patient Class: Ambulatory/Outpatient

## 2024-03-26 NOTE — Telephone Encounter (Signed)
 Discussed with Dr. Larrie Po.  Offered tore-schedule procedure to 06/01/2024.  Patient made aware and agreeable.

## 2024-03-27 DIAGNOSIS — Z1231 Encounter for screening mammogram for malignant neoplasm of breast: Secondary | ICD-10-CM | POA: Diagnosis not present

## 2024-03-30 ENCOUNTER — Encounter (HOSPITAL_COMMUNITY): Admission: RE | Admit: 2024-03-30 | Source: Ambulatory Visit

## 2024-04-01 DIAGNOSIS — Z01818 Encounter for other preprocedural examination: Secondary | ICD-10-CM

## 2024-05-26 NOTE — Patient Instructions (Signed)
 Rhonda Rhodes  05/26/2024     @PREFPERIOPPHARMACY @   Your procedure is scheduled on  06/01/2024.   Report to [facility] at  0600  A.M.   Call this number if you have problems the morning of surgery:  [phone]  If you experience any cold or flu symptoms such as cough, fever, chills, shortness of breath, etc. between now and your scheduled surgery, please notify us  at the above number.   Remember:  Do not eat or after midnight.   You may drink clear liquids until  0330 am on 06/01/2024.    Clear liquids allowed are:                    Water, Juice (No red color; non-citric and without pulp; diabetics please choose diet or no sugar options), Carbonated beverages (diabetics please choose diet or no sugar options), Clear Tea (No creamer, milk, or cream, including half & half and powdered creamer), Black Coffee Only (No creamer, milk or cream, including half & half and powdered creamer), and Clear Sports drink (No red color; diabetics please choose diet or no sugar options)    Take these medicines the morning of surgery with A SIP OF WATER                                                     None.    Do not wear jewelry, make-up or nail polish, including gel polish,  artificial nails, or any other type of covering on natural nails (fingers and  toes).  Do not wear lotions, powders, or perfumes, or deodorant.  Do not shave 48 hours prior to surgery.  Men may shave face and neck.  Do not bring valuables to the hospital.  [facility] is not responsible for any belongings or valuables.  Contacts, dentures or bridgework may not be worn into surgery.  Leave your suitcase in the car.  After surgery it may be brought to your room.  For patients admitted to the hospital, discharge time will be determined by your treatment team.  Patients discharged the day of surgery will not be allowed to drive home and must have someone with them for 24 hours.    Special instructions:   DO NOT  smoke tobacco or vape for 24 hours before your procedure.  Please read over the following fact sheets that you were given. Coughing and Deep Breathing, Surgical Site Infection Prevention, Anesthesia Post-op Instructions, and Care and Recovery After Surgery       Incision Care, Adult An incision is a cut that a doctor makes in your skin for surgery. Most times, these cuts are closed after surgery. Your cut from surgery may be closed with: Stitches (sutures). Staples. Skin glue. Skin tape (adhesive) strips. You may need to go back to your doctor to have stitches or staples taken out. This may happen many days or many weeks after your surgery. You need to take good care of your cut so it does not get infected. Follow instructions from your doctor about how to care for your cut. Supplies needed: Soap and water. A clean hand towel. Wound cleanser. A clean bandage (dressing), if needed. Cream or ointment, if told by your doctor. Clean gauze. How to care for your cut from surgery Cleaning your  cut Ask your doctor how to clean your cut. You may need to: Wear medical gloves. Use mild soap and water, or a wound cleanser. Use a clean gauze to pat your cut dry after you clean it. Changing your bandage Wash your hands with soap and water for at least 20 seconds before and after you change your bandage. If you cannot use soap and water, use hand sanitizer. Do not usedisinfectants or antiseptics, such as rubbing alcohol, to clean your wound unless told by your doctor. Change your bandage as told by your doctor. Leavestitches or skin glue in place for at least 2 weeks. Leave tape strips alone unless you are told to take them off. You may trim the edges of the tape strips if they curl up. Put a cream or ointment on your cut. Do this only as told. Cover your cut with a clean bandage. Ask your doctor when you can leave your cut uncovered. Checking for infection Check your cut area every day for  signs of infection. Check for: More redness, swelling, or pain. More fluid or blood. New warmth. Hardness or a new rash around the incision. Pus or a bad smell.  Follow these instructions at home Medicines Take over-the-counter and prescription medicines only as told by your doctor. If you were prescribed an antibiotic medicine, cream, or ointment, use it as told by your doctor. Do not stop using the antibiotic even if you start to feel better. Eating and drinking Eat foods that have a lot of certain nutrients, such as protein, vitamin A, and vitamin C. These foods help your cut heal. Foods rich in protein include meat, fish, eggs, dairy, beans, nuts, and protein drinks. Foods rich in vitamin A include carrots and dark green, leafy vegetables. Foods rich in vitamin C include citrus fruits, tomatoes, broccoli, and peppers. Drink enough fluid to keep your pee (urine) pale yellow. General instructions  Do not take baths, swim, or use a hot tub. Ask your doctor about taking showers or sponge baths. Limit movement around your cut. This helps with healing. Try not to strain, lift, or exercise for the first 2 weeks, or for as long as told by your doctor. Return to your normal activities as told by your doctor. Ask your doctor what activities are safe for you. Do not scratch, scrub, or pick at your cut. Keep it covered as told by your doctor. Protect your cut from the sun when you are outside for the first 6 months, or for as long as told by your doctor. Cover up the scar area or put on sunscreen that has an SPF of at least 30. Do not use any products that contain nicotine or tobacco, such as cigarettes, e-cigarettes, and chewing tobacco. These can delay cut healing. If you need help quitting, ask your doctor. Keep all follow-up visits. Contact a doctor if: You have any of these signs of infection around your cut: More redness, swelling, or pain. More fluid or blood. New warmth or  hardness. Pus or a bad smell. A new rash. You have a fever. You feel like you may vomit (nauseous). You vomit. You are dizzy. Your stitches, staples, skin glue, or tape strips come undone. Your cut gets bigger. You have a fever. Get help right away if: Your cut bleeds through your bandage, and bleeding does not stop with gentle pressure. Your cut opens up and comes apart. These symptoms may be an emergency. Do not wait to see if the symptoms will go  away. Get medical help right away. Call your local emergency services (911 in the [place] Do not drive yourself to the hospital. Summary Follow instructions from your doctor about how to care for your cut. Wash your hands with soap and water for at least 20 seconds before and after you change your bandage. If you cannot use soap and water, use hand sanitizer. Check your cut area every day for signs of infection. Keep all follow-up visits. This information is not intended to replace advice given to you by your health care provider. Make sure you discuss any questions you have with your health care provider. Document Revised: 02/13/2021 Document Reviewed: 02/13/2021 Elsevier Patient Education  2024 [employer].General Anesthesia, Adult, Care After The following information offers guidance on how to care for yourself after your procedure. Your health care provider may also give you more specific instructions. If you have problems or questions, contact your health care provider. What can I expect after the procedure? After the procedure, it is common for people to: Have pain or discomfort at the IV site. Have nausea or vomiting. Have a sore throat or hoarseness. Have trouble concentrating. Feel cold or chills. Feel weak, sleepy, or tired (fatigue). Have soreness and body aches. These can affect parts of the body that were not involved in surgery. Follow these instructions at home: For the time period you were told by your health care  provider:  Rest. Do not participate in activities where you could fall or become injured. Do not drive or use machinery. Do not drink alcohol. Do not take sleeping pills or medicines that cause drowsiness. Do not make important decisions or sign legal documents. Do not take care of children on your own. General instructions Drink enough fluid to keep your urine pale yellow. If you have sleep apnea, surgery and certain medicines can increase your risk for breathing problems. Follow instructions from your health care provider about wearing your sleep device: Anytime you are sleeping, including during daytime naps. While taking prescription pain medicines, sleeping medicines, or medicines that make you drowsy. Return to your normal activities as told by your health care provider. Ask your health care provider what activities are safe for you. Take over-the-counter and prescription medicines only as told by your health care provider. Do not use any products that contain nicotine or tobacco. These products include cigarettes, chewing tobacco, and vaping devices, such as e-cigarettes. These can delay incision healing after surgery. If you need help quitting, ask your health care provider. Contact a health care provider if: You have nausea or vomiting that does not get better with medicine. You vomit every time you eat or drink. You have pain that does not get better with medicine. You cannot urinate or have bloody urine. You develop a skin rash. You have a fever. Get help right away if: You have trouble breathing. You have chest pain. You vomit blood. These symptoms may be an emergency. Get help right away. Call 911. Do not wait to see if the symptoms will go away. Do not drive yourself to the hospital. Summary After the procedure, it is common to have a sore throat, hoarseness, nausea, vomiting, or to feel weak, sleepy, or fatigue. For the time period you were told by your health care  provider, do not drive or use machinery. Get help right away if you have difficulty breathing, have chest pain, or vomit blood. These symptoms may be an emergency. This information is not intended to replace advice given to  you by your health care provider. Make sure you discuss any questions you have with your health care provider. Document Revised: 02/09/2022 Document Reviewed: 02/09/2022 Elsevier Patient Education  2024 Else[employer] Inc.How to Use Chlorhexidine at Home in the Shower Chlorhexidine gluconate (CHG) is a germ-killing (antiseptic) wash that's used to clean the skin. It can get rid of the germs that normally live on the skin and can keep them away for about 24 hours. If you're having surgery, you may be told to shower with CHG at home the night before surgery. This can help lower your risk for infection. To use CHG wash in the shower, follow the steps below. Supplies needed: CHG body wash. Clean washcloth. Clean towel. How to use CHG in the shower Follow these steps unless you're told to use CHG in a different way: Start the shower. Use your normal soap and shampoo to wash your face and hair. Turn off the shower or move out of the shower stream. Pour CHG onto a clean washcloth. Do not use any type of brush or rough sponge. Start at your neck, washing your body down to your toes. Make sure you: Wash the part of your body where the surgery will be done for at least 1 minute. Do not scrub. Do not use CHG on your head or face unless your health care provider tells you to. If it gets into your ears or eyes, rinse them well with water. Do not wash your genitals with CHG. Wash your back and under your arms. Make sure to wash skin folds. Let the CHG sit on your skin for 1-2 minutes or as long as told. Rinse your entire body in the shower, including all body creases and folds. Turn off the shower. Dry off with a clean towel. Do not put anything on your skin afterward, such as powder,  lotion, or perfume. Put on clean clothes or pajamas. If it's the night before surgery, sleep in clean sheets. General tips Use CHG only as told, and follow the instructions on the label. Use the full amount of CHG as told. This is often one bottle. Do not smoke and stay away from flames after using CHG. Your skin may feel sticky after using CHG. This is normal. The sticky feeling will go away as the CHG dries. Do not use CHG: If you have a chlorhexidine allergy or have reacted to chlorhexidine in the past. On open wounds or areas of skin that have broken skin, cuts, or scrapes. On babies younger than 30 months of age. Contact a health care provider if: You have questions about using CHG. Your skin gets irritated or itchy. You have a rash after using CHG. You swallow any CHG. Call your local poison control center [phone] in the [place] Your eyes itch badly, or they become very red or swollen. Your hearing changes. You have trouble seeing. If you can't reach your provider, go to an urgent care or emergency room. Do not drive yourself. Get help right away if: You have swelling or tingling in your mouth or throat. You make high-pitched whistling sounds when you breathe, most often when you breathe out (wheeze). You have trouble breathing. These symptoms may be an emergency. Call 911 right away. Do not wait to see if the symptoms will go away. Do not drive yourself to the hospital. This information is not intended to replace advice given to you by your health care provider. Make sure you discuss any questions you have with your health  care provider. Document Revised: 05/28/2023 Document Reviewed: 05/24/2022 Elsevier Patient Education  2024 [employer].

## 2024-05-27 ENCOUNTER — Encounter (HOSPITAL_COMMUNITY): Payer: Self-pay

## 2024-05-27 ENCOUNTER — Encounter (HOSPITAL_COMMUNITY)
Admission: RE | Admit: 2024-05-27 | Discharge: 2024-05-27 | Disposition: A | Discharge status: Home / Self Care | Source: Ambulatory Visit | Attending: General Surgery | Admitting: General Surgery

## 2024-05-27 ENCOUNTER — Other Ambulatory Visit: Payer: Self-pay

## 2024-05-27 DIAGNOSIS — Z01812 Encounter for preprocedural laboratory examination: Secondary | ICD-10-CM | POA: Insufficient documentation

## 2024-05-27 DIAGNOSIS — Z01818 Encounter for other preprocedural examination: Secondary | ICD-10-CM

## 2024-05-27 LAB — HCG, SERUM, QUALITATIVE: Preg, Serum: NEGATIVE

## 2024-06-01 ENCOUNTER — Encounter (HOSPITAL_COMMUNITY): Payer: Self-pay | Admitting: General Surgery

## 2024-06-01 ENCOUNTER — Ambulatory Visit (HOSPITAL_COMMUNITY): Admitting: Anesthesiology

## 2024-06-01 ENCOUNTER — Other Ambulatory Visit: Payer: Self-pay

## 2024-06-01 ENCOUNTER — Encounter (HOSPITAL_COMMUNITY)
Admission: RE | Disposition: A | Discharge status: Home / Self Care | Payer: Self-pay | Source: Home / Self Care | Attending: General Surgery

## 2024-06-01 ENCOUNTER — Ambulatory Visit (HOSPITAL_COMMUNITY)
Admission: RE | Admit: 2024-06-01 | Discharge: 2024-06-01 | Disposition: A | Discharge status: Home / Self Care | Attending: General Surgery | Admitting: General Surgery

## 2024-06-01 DIAGNOSIS — Z01818 Encounter for other preprocedural examination: Secondary | ICD-10-CM

## 2024-06-01 DIAGNOSIS — L723 Sebaceous cyst: Secondary | ICD-10-CM

## 2024-06-01 DIAGNOSIS — I10 Essential (primary) hypertension: Secondary | ICD-10-CM | POA: Insufficient documentation

## 2024-06-01 DIAGNOSIS — L929 Granulomatous disorder of the skin and subcutaneous tissue, unspecified: Secondary | ICD-10-CM | POA: Diagnosis not present

## 2024-06-01 DIAGNOSIS — R2231 Localized swelling, mass and lump, right upper limb: Secondary | ICD-10-CM | POA: Diagnosis not present

## 2024-06-01 DIAGNOSIS — N6011 Diffuse cystic mastopathy of right breast: Secondary | ICD-10-CM | POA: Diagnosis not present

## 2024-06-01 DIAGNOSIS — N6041 Mammary duct ectasia of right breast: Secondary | ICD-10-CM | POA: Insufficient documentation

## 2024-06-01 HISTORY — PX: EXCISION MASS UPPER EXTREMETIES: SHX6704

## 2024-06-01 SURGERY — EXCISION MASS UPPER EXTREMITIES
Anesthesia: General | Site: Axilla | Laterality: Right

## 2024-06-01 MED ORDER — HYDROCODONE-ACETAMINOPHEN 5-325 MG PO TABS: 1.0000 | ORAL_TABLET | ORAL | 0 refills | Status: DC | PRN

## 2024-06-01 MED ORDER — PROPOFOL 10 MG/ML IV BOLUS
INTRAVENOUS | Status: AC
  Filled 2024-06-01: qty 20

## 2024-06-01 MED ORDER — FENTANYL CITRATE PF 50 MCG/ML IJ SOSY: 25.0000 ug | PREFILLED_SYRINGE | INTRAMUSCULAR | Status: DC | PRN

## 2024-06-01 MED ORDER — ORAL CARE MOUTH RINSE: 15.0000 mL | Freq: Once | OROMUCOSAL | Status: DC

## 2024-06-01 MED ORDER — ONDANSETRON HCL 4 MG/2ML IJ SOLN
INTRAMUSCULAR | Status: DC | PRN
Start: 2024-06-01 — End: 2024-06-01
  Administered 2024-06-01: 4 mg via INTRAVENOUS

## 2024-06-01 MED ORDER — LACTATED RINGERS IV SOLN: INTRAVENOUS | Status: DC | PRN

## 2024-06-01 MED ORDER — BUPIVACAINE HCL (PF) 0.5 % IJ SOLN
INTRAMUSCULAR | Status: DC | PRN
Start: 2024-06-01 — End: 2024-06-01
  Administered 2024-06-01: 8 mL

## 2024-06-01 MED ORDER — FENTANYL CITRATE (PF) 100 MCG/2ML IJ SOLN
INTRAMUSCULAR | Status: AC
Start: 2024-06-01 — End: 2024-06-01
  Filled 2024-06-01: qty 2

## 2024-06-01 MED ORDER — OXYCODONE HCL 5 MG/5ML PO SOLN: 5.0000 mg | Freq: Once | ORAL | Status: DC | PRN

## 2024-06-01 MED ORDER — OXYCODONE HCL 5 MG PO TABS: 5.0000 mg | ORAL_TABLET | Freq: Once | ORAL | Status: DC | PRN

## 2024-06-01 MED ORDER — ONDANSETRON HCL 4 MG/2ML IJ SOLN: 4.0000 mg | Freq: Once | INTRAMUSCULAR | Status: DC | PRN

## 2024-06-01 MED ORDER — BUPIVACAINE HCL (PF) 0.5 % IJ SOLN
INTRAMUSCULAR | Status: AC
  Filled 2024-06-01: qty 30

## 2024-06-01 MED ORDER — CHLORHEXIDINE GLUCONATE 0.12 % MT SOLN: 15.0000 mL | Freq: Once | OROMUCOSAL | Status: DC

## 2024-06-01 MED ORDER — ONDANSETRON HCL 4 MG/2ML IJ SOLN
INTRAMUSCULAR | Status: AC
Start: 2024-06-01 — End: 2024-06-01
  Filled 2024-06-01: qty 2

## 2024-06-01 MED ORDER — CHLORHEXIDINE GLUCONATE CLOTH 2 % EX PADS: 6.0000 | MEDICATED_PAD | Freq: Once | CUTANEOUS | Status: DC

## 2024-06-01 MED ORDER — CEFAZOLIN SODIUM-DEXTROSE 2-4 GM/100ML-% IV SOLN
INTRAVENOUS | Status: AC
  Filled 2024-06-01: qty 100

## 2024-06-01 MED ORDER — FENTANYL CITRATE (PF) 100 MCG/2ML IJ SOLN
INTRAMUSCULAR | Status: DC | PRN
  Administered 2024-06-01 (×4): 25 ug via INTRAVENOUS

## 2024-06-01 MED ORDER — DEXAMETHASONE SODIUM PHOSPHATE 10 MG/ML IJ SOLN
INTRAMUSCULAR | Status: DC | PRN
  Administered 2024-06-01: 5 mg via INTRAVENOUS

## 2024-06-01 MED ORDER — MIDAZOLAM HCL 2 MG/2ML IJ SOLN
INTRAMUSCULAR | Status: DC | PRN
  Administered 2024-06-01: 2 mg via INTRAVENOUS

## 2024-06-01 MED ORDER — CHLORHEXIDINE GLUCONATE 0.12 % MT SOLN
15.0000 mL | Freq: Once | OROMUCOSAL | Status: AC
  Administered 2024-06-01: 15 mL via OROMUCOSAL

## 2024-06-01 MED ORDER — CEFAZOLIN SODIUM-DEXTROSE 2-4 GM/100ML-% IV SOLN
2.0000 g | INTRAVENOUS | Status: AC
  Administered 2024-06-01: 2 g via INTRAVENOUS

## 2024-06-01 MED ORDER — MIDAZOLAM HCL 2 MG/2ML IJ SOLN
INTRAMUSCULAR | Status: AC
  Filled 2024-06-01: qty 2

## 2024-06-01 MED ORDER — SEVOFLURANE IN SOLN
RESPIRATORY_TRACT | Status: AC
Start: 2024-06-01 — End: 2024-06-01
  Filled 2024-06-01: qty 250

## 2024-06-01 MED ORDER — LACTATED RINGERS IV SOLN: INTRAVENOUS | Status: DC

## 2024-06-01 MED ORDER — HYDROCODONE-ACETAMINOPHEN 5-325 MG PO TABS: 1.0000 | ORAL_TABLET | ORAL | 0 refills | Status: AC | PRN

## 2024-06-01 MED ORDER — PROPOFOL 10 MG/ML IV BOLUS
INTRAVENOUS | Status: DC | PRN
  Administered 2024-06-01: 50 mg via INTRAVENOUS
  Administered 2024-06-01: 100 mg via INTRAVENOUS
  Administered 2024-06-01: 50 mg via INTRAVENOUS

## 2024-06-01 MED ORDER — LIDOCAINE 2% (20 MG/ML) 5 ML SYRINGE
INTRAMUSCULAR | Status: AC
Start: 2024-06-01 — End: 2024-06-01
  Filled 2024-06-01: qty 5

## 2024-06-01 MED ORDER — 0.9 % SODIUM CHLORIDE (POUR BTL) OPTIME
TOPICAL | Status: DC | PRN
  Administered 2024-06-01: 1000 mL

## 2024-06-01 MED ORDER — DEXAMETHASONE SODIUM PHOSPHATE 10 MG/ML IJ SOLN
INTRAMUSCULAR | Status: AC
  Filled 2024-06-01: qty 1

## 2024-06-01 SURGICAL SUPPLY — 18 items
BLADE SURG SZ11 CARB STEEL (BLADE) IMPLANT
DERMABOND ADVANCED .7 DNX12 (GAUZE/BANDAGES/DRESSINGS) IMPLANT
ELECTRODE REM PT RTRN 9FT ADLT (ELECTROSURGICAL) ×1 IMPLANT
GLOVE BIOGEL PI IND STRL 7.0 (GLOVE) ×2 IMPLANT
GLOVE SURG SS PI 7.5 STRL IVOR (GLOVE) ×2 IMPLANT
GOWN STRL REUS W/TWL LRG LVL3 (GOWN DISPOSABLE) ×2 IMPLANT
KIT TURNOVER KIT A (KITS) ×1 IMPLANT
MANIFOLD NEPTUNE II (INSTRUMENTS) ×1 IMPLANT
NDL HYPO 25X1 1.5 SAFETY (NEEDLE) ×1 IMPLANT
NEEDLE HYPO 25X1 1.5 SAFETY (NEEDLE) ×1 IMPLANT
NS IRRIG 1000ML POUR BTL (IV SOLUTION) ×1 IMPLANT
PACK MINOR (CUSTOM PROCEDURE TRAY) ×1 IMPLANT
PAD ARMBOARD POSITIONER FOAM (MISCELLANEOUS) ×1 IMPLANT
POSITIONER HEAD 8X9X4 ADT (SOFTGOODS) ×1 IMPLANT
SET BASIN LINEN APH (SET/KITS/TRAYS/PACK) ×1 IMPLANT
SUT MNCRL AB 4-0 PS2 18 (SUTURE) IMPLANT
SUT VIC AB 3-0 SH 27X BRD (SUTURE) IMPLANT
SYR CONTROL 10ML LL (SYRINGE) ×1 IMPLANT

## 2024-06-01 NOTE — Anesthesia Procedure Notes (Signed)
 Procedure Name: LMA Insertion Date/Time: 06/01/2024 8:52 AM  Performed by: Augusta Daved SAILOR, CRNAPre-anesthesia Checklist: Patient identified, Emergency Drugs available, Suction available and Patient being monitored Patient Re-evaluated:Patient Re-evaluated prior to induction Oxygen Delivery Method: Circle System Utilized Preoxygenation: Pre-oxygenation with 100% oxygen Induction Type: IV induction Ventilation: Mask ventilation without difficulty LMA: LMA inserted LMA Size: 4.0 Number of attempts: 1 Airway Equipment and Method: Bite block Placement Confirmation: positive ETCO2 Tube secured with: Tape Dental Injury: Teeth and Oropharynx as per pre-operative assessment

## 2024-06-01 NOTE — Transfer of Care (Signed)
 Immediate Anesthesia Transfer of Care Note  Patient: Rhonda Rhodes  Procedure(s) Performed: EXCISION MASS UPPER EXTREMITIES (Right: Axilla)  Patient Location: PACU  Anesthesia Type:General  Level of Consciousness: drowsy and patient cooperative  Airway & Oxygen Therapy: Patient Spontanous Breathing and Patient connected to face mask oxygen  Post-op Assessment: Report given to RN and Post -op Vital signs reviewed and stable  Post vital signs: Reviewed and stable  Last Vitals:  Vitals Value Taken Time  BP 119/83 06/01/24 09:21  Temp    Pulse 76 06/01/24 09:22  Resp 13 06/01/24 09:22  SpO2 100 % 06/01/24 09:22  Vitals shown include unfiled device data.  Last Pain:  Vitals:   06/01/24 0729  PainSc: 0-No pain      Patients Stated Pain Goal: 6 (06/01/24 0729)  Complications: No notable events documented.

## 2024-06-01 NOTE — Op Note (Signed)
 Patient:  Rhonda Rhodes  DOB:  04/04/1976  MRN:  [id number]   Preop Diagnosis: Right axillary subcutaneous mass  Postop Diagnosis: Same, residual sebaceous cyst  Procedure: Excision of granulomatous tissue, right axilla  Surgeon: Oneil Budge, MD  Anes: General  Indications: Patient is a 48 year old white female who presents with a subcutaneous mass in the right axilla.  The risks and benefits of the procedure including bleeding, infection, and the possibility of recurrence were fully explained to the patient, who gave informed consent.  Procedure note: Patient was placed in supine position.  After induction of general anesthesia, the right axilla was prepped and draped using the usual sterile technique with ChloraPrep.  Surgical site confirmation was performed.  An incision was made over the mass.  It was taken down to the subcutaneous tissue.  Old scar and granulomatous tissue was found suggestive of a ruptured sebaceous cyst.  This was excised without difficulty.  Total incision line was 2.5 cm.  This was sent to pathology further examination.  No lymphadenopathy was noted.  0.5% Sensorcaine  was instilled into the surrounding wound.  The subcutaneous layer was reapproximated using a 3-0 Vicryl interrupted suture.  The skin was closed using a 4-0 Monocryl subcuticular suture.  Dermabond was applied.  All tape and needle counts were correct at the end of the procedure.  The patient was awakened and transferred to PACU in stable condition.  Complications: None  EBL: Minimal  Specimen: Granulomatous tissue, right axilla
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
## 2024-06-01 NOTE — Anesthesia Preprocedure Evaluation (Signed)
Anesthesia Evaluation  Patient identified by MRN, date of birth, ID band Patient awake    Reviewed: Allergy & Precautions, H&P , NPO status , Patient's Chart, lab work & pertinent test results, reviewed documented beta blocker date and time   Airway Mallampati: II  TM Distance: >3 FB Neck ROM: full    Dental no notable dental hx.    Pulmonary neg pulmonary ROS   Pulmonary exam normal breath sounds clear to auscultation       Cardiovascular Exercise Tolerance: Good hypertension, negative cardio ROS  Rhythm:regular Rate:Normal     Neuro/Psych negative neurological ROS  negative psych ROS   GI/Hepatic negative GI ROS, Neg liver ROS,,,  Endo/Other  negative endocrine ROS    Renal/GU negative Renal ROS  negative genitourinary   Musculoskeletal   Abdominal   Peds  Hematology negative hematology ROS (+)   Anesthesia Other Findings   Reproductive/Obstetrics negative OB ROS                             Anesthesia Physical Anesthesia Plan  ASA: 2  Anesthesia Plan: General and General LMA   Post-op Pain Management:    Induction:   PONV Risk Score and Plan: Ondansetron  Airway Management Planned:   Additional Equipment:   Intra-op Plan:   Post-operative Plan:   Informed Consent: I have reviewed the patients History and Physical, chart, labs and discussed the procedure including the risks, benefits and alternatives for the proposed anesthesia with the patient or authorized representative who has indicated his/her understanding and acceptance.     Dental Advisory Given  Plan Discussed with: CRNA  Anesthesia Plan Comments:        Anesthesia Quick Evaluation

## 2024-06-01 NOTE — Interval H&P Note (Signed)
 History and Physical Interval Note:  06/01/2024 8:22 AM  Rhonda Rhodes  has presented today for surgery, with the diagnosis of MASS, RIGHT AXILLA.  The various methods of treatment have been discussed with the patient and family. After consideration of risks, benefits and other options for treatment, the patient has consented to  Procedure(s): EXCISION MASS UPPER EXTREMITIES (Right) as a surgical intervention.  The patient's history has been reviewed, patient examined, no change in status, stable for surgery.  I have reviewed the patient's chart and labs.  Questions were answered to the patient's satisfaction.     Oneil Budge

## 2024-06-01 NOTE — Anesthesia Postprocedure Evaluation (Signed)
 Anesthesia Post Note  Patient: Rhonda Rhodes  Procedure(s) Performed: EXCISION MASS UPPER EXTREMITIES (Right: Axilla)  Patient location during evaluation: Phase II Anesthesia Type: General Level of consciousness: awake Pain management: pain level controlled Vital Signs Assessment: post-procedure vital signs reviewed and stable Respiratory status: spontaneous breathing and respiratory function stable Cardiovascular status: blood pressure returned to baseline and stable Postop Assessment: no headache and no apparent nausea or vomiting Anesthetic complications: no Comments: Late entry   No notable events documented.   Last Vitals:  Vitals:   06/01/24 0930 06/01/24 0945  BP: 118/84 129/88  Pulse: 68 72  Resp: 12 18  Temp:  36.7 C  SpO2: 100% 100%    Last Pain:  Vitals:   06/01/24 0945  PainSc: 0-No pain                 [name]

## 2024-06-02 ENCOUNTER — Encounter (HOSPITAL_COMMUNITY): Payer: Self-pay | Admitting: General Surgery

## 2024-06-02 ENCOUNTER — Telehealth (INDEPENDENT_AMBULATORY_CARE_PROVIDER_SITE_OTHER): Admitting: General Surgery

## 2024-06-02 DIAGNOSIS — Z09 Encounter for follow-up examination after completed treatment for conditions other than malignant neoplasm: Secondary | ICD-10-CM

## 2024-06-02 LAB — SURGICAL PATHOLOGY

## 2024-06-02 NOTE — Telephone Encounter (Signed)
 Contacted patient by phone about her results from her surgery on the right axilla.  Pathology showed benign breast tissue interestingly.  No other findings noted.  Patient is aware.  She states she is doing well with minimal incisional pain.  I told her to call me should any problems arise.  Total telephone time was 1/2 minutes.  As this was a part of the total global surgical fee, this was not a billable visit.    Results Surgical pathology (Order [id number]) MyChart Results Release  MyChart Status: Active  Results Release   Surgical pathology Order: [id number]  Status: Edited Result - FINAL     Next appt: None   Test Result Released: No (scheduled for 06/02/2024 11:12 AM)   0 Result Notes    Component Ref Range & Units (hover) 1 d ago  SURGICAL PATHOLOGY SURGICAL PATHOLOGY CASE: [id number] PATIENT: Rhonda Rhodes Surgical Pathology Report     Clinical History: Mass, right axilla (las)     FINAL MICROSCOPIC DIAGNOSIS:  A. AXILLARY MASS, RIGHT, EXCISION: -  Benign breast tissue with prominently dilated duct//duct ectasia in the background of fibrocystic change.    GROSS DESCRIPTION:  Received in formalin is a 1.3 x 1 x 0.9 cm yellow-pink to dark red soft to indurated tissue which is sectioned and entirely submitted in 1 block.  SW 06/01/2024   Final Diagnosis performed by Berneita Sanagustin LeGolvan DO.   Electronically signed 06/02/2024 Technical component performed at [facility], [address] [place], [place] [address].  Professional component performed at [facility], [address] [place], [place] [address].  Immunohistochemistry Technical component (if applicable) was performed at [facility]. [address], [place], [place] [address].   IMMUNOHISTOCHEMISTRY DISCLAIMER (if applicable): Some of these immunohistochemical stains may have been developed and the performance characteristics determine  by [facility]. Some may not have been cleared or approved by the [place]. Food and Drug Administration. The FDA has determined that such clearance or approval is not necessary. This test is used for clinical purposes. It should not be regarded as investigational or for research. This laboratory is certified under the Clinical Laboratory Improvement Amendments of 1988 (CLIA-88) as qualified to perform high complexity clinical laboratory testing.  The controls stained appropriately.   IHC stains are performed on formalin fixed, paraffin embedded tissue using a 3,3diaminobenzidine (DAB) chromogen and Leica Bond Autostainer System. The staining intensity of the nucleus is score manually and is reported as the percentage of tumor cell nuclei demonstrating specific nuclear staining. The specimens are fixed in 10% Neutral Formalin for at least 6 hours and up to 72hrs. These tests are validated on decalcified tissue. Results should be interpreted with caution given the possibility of false negative results on decalcified specimens. Antibody Clones are as follows ER-clone 16F, PR-clone 16, Ki67- clone MM1. Some of these immunohistochemical stains may have been developed and the performance characteristics determined by [facility].  Resulting Agency [facility]

## 2024-06-11 ENCOUNTER — Telehealth: Payer: Self-pay | Admitting: Family Medicine

## 2024-06-11 NOTE — Telephone Encounter (Signed)
 Pt called stating that there is a knot formed where the excision is and was wanting to make sure it was not anything unusual.  It is swollen directly under the surgical site and is not draining nor is it hot or bothering her.  Informed her that it sounds like a seroma and that happens sometimes when the body tried to fill in a hole. She should let us  know if anything changes ie. Fever, redness or thick puss-like drainage. Patient verbalized understanding and appreciative of call back.
